# Patient Record
Sex: Male | Born: 1985
Health system: Southern US, Community
[De-identification: ages and names within clinical notes are randomized; demographics above are authoritative.]

---

## 2015-01-10 ENCOUNTER — Emergency Department (HOSPITAL_COMMUNITY)
Admission: EM | Admit: 2015-01-10 | Discharge: 2015-01-10 | Disposition: A | Discharge status: Home / Self Care | Payer: Self-pay | Attending: Emergency Medicine | Admitting: Emergency Medicine

## 2015-01-10 ENCOUNTER — Encounter (HOSPITAL_COMMUNITY): Payer: Self-pay | Admitting: *Deleted

## 2015-01-10 ENCOUNTER — Emergency Department (HOSPITAL_COMMUNITY): Payer: Self-pay

## 2015-01-10 DIAGNOSIS — M5441 Lumbago with sciatica, right side: Secondary | ICD-10-CM | POA: Insufficient documentation

## 2015-01-10 DIAGNOSIS — M79661 Pain in right lower leg: Secondary | ICD-10-CM | POA: Insufficient documentation

## 2015-01-10 MED ORDER — CYCLOBENZAPRINE HCL 10 MG PO TABS: 10.0000 mg | ORAL_TABLET | Freq: Two times a day (BID) | ORAL | Status: DC | PRN

## 2015-01-10 MED ORDER — CYCLOBENZAPRINE HCL 10 MG PO TABS
5.0000 mg | ORAL_TABLET | Freq: Once | ORAL | Status: AC
  Administered 2015-01-10: 5 mg via ORAL
  Filled 2015-01-10: qty 1

## 2015-01-10 MED ORDER — ONDANSETRON 4 MG PO TBDP
4.0000 mg | ORAL_TABLET | Freq: Once | ORAL | Status: AC
  Administered 2015-01-10: 4 mg via ORAL
  Filled 2015-01-10: qty 1

## 2015-01-10 MED ORDER — DEXAMETHASONE SODIUM PHOSPHATE 10 MG/ML IJ SOLN
10.0000 mg | Freq: Once | INTRAMUSCULAR | Status: AC
  Administered 2015-01-10: 10 mg via INTRAMUSCULAR
  Filled 2015-01-10: qty 1

## 2015-01-10 MED ORDER — HYDROCODONE-ACETAMINOPHEN 5-325 MG PO TABS: 1.0000 | ORAL_TABLET | ORAL | Status: DC | PRN

## 2015-01-10 MED ORDER — OXYCODONE-ACETAMINOPHEN 5-325 MG PO TABS
2.0000 | ORAL_TABLET | Freq: Once | ORAL | Status: AC
  Administered 2015-01-10: 2 via ORAL
  Filled 2015-01-10: qty 2

## 2015-01-10 MED ORDER — PREDNISONE 10 MG PO TABS: 20.0000 mg | ORAL_TABLET | Freq: Every day | ORAL | Status: DC

## 2015-01-10 NOTE — Discharge Instructions (Signed)
Citica  (Sciatica)  La citica es Conservation officer, historic buildings, debilidad, entumecimiento u hormigueo a lo largo del nervio citico. El nervio comienza en la zona inferior de la espalda y desciende por la parte posterior de cada pierna. El nervio controla los msculos de la parte inferior de la pierna y de la zona posterior de la rodilla, y transmite la sensibilidad a la parte posterior del muslo, la pierna y la planta del pie. La citica es un sntoma de otras afecciones mdicas. Por ejemplo, un dao a los nervios o algunas enfermedades como un disco herniado o un espoln seo en la columna vertebral, podran daarle o presionar en el nervio citico. Esto causa dolor, debilidad y otras sensaciones normalmente asociadas con la citica. Generalmente la citica afecta slo un lado del cuerpo. CAUSAS   Disco herniado o desplazado.  Enfermedad degenerativa del disco.  Un sndrome doloroso que compromete un msculo angosto de los glteos (sndrome piriforme).  Lesin o fractura plvica.  Embarazo.  Tumor (casos raros). SNTOMAS  Los sntomas pueden variar de leves a muy graves. Por lo general, los sntomas descienden desde la zona lumbar a las nalgas y la parte posterior de la pierna. Ellos son:   Hormigueo leve o dolor sordo en la parte inferior de la espalda, la pierna o la cadera.  Adormecimiento en la parte posterior de la pantorrilla o la planta del pie.  Sensacin de [employer] zona lumbar, la pierna o la cadera.  Dolor agudo en la zona inferior de la espalda, la pierna o la cadera.  Debilidad en las piernas.  Dolor de espalda intenso que [employer] movimientos. Los sntomas pueden empeorar al toser, Brewing technologist, rer o estar sentado o parado durante [employer]. Adems, el sobrepeso puede empeorar los sntomas.  DIAGNSTICO  Su mdico le har un examen fsico para buscar los sntomas comunes de la citica. Le pedir que haga algunos movimientos o actividades que activaran el dolor del nervio  citico. Para encontrar las causas de la citica podr indicarle otros estudios. Estos pueden ser:   Anlisis de Ignacio.  Radiografas.  Pruebas de diagnstico por imgenes, como resonancia magntica o tomografa computada. TRATAMIENTO  El tratamiento se dirige a las causas de la citica. A veces, el tratamiento no es necesario, y Conservation officer, historic buildings y Health and safety inspector desaparecen por s mismos. Si necesita tratamiento, su mdico puede sugerir:   Medicamentos de venta libre para Best boy.  Medicamentos recetados, como antiinflamatorios, relajantes musculares o narcticos.  Aplicacin de calor o hielo en la zona del dolor.  Inyecciones de corticoides para disminuir el dolor, la irritacin y la inflamacin alrededor del nervio.  Reduccin de la [employer] perodos de [place].  Ejercicios y estiramiento del abdomen para fortalecer y Teacher, English as a foreign language la flexibilidad de la columna vertebral. Su mdico puede sugerirle perder peso si el peso extra empeora el dolor de espalda.  Fisioterapia.  La ciruga para eliminar lo que presiona o pincha el nervio, como un espoln seo o parte de una hernia de disco. INSTRUCCIONES PARA EL CUIDADO EN EL HOGAR   Slo tome medicamentos de venta libre o recetados para Glass blower/designer o Health and safety inspector, segn las indicaciones de su mdico.  Aplique hielo sobre el rea dolorida durante 20 minutos 3-4 veces por da durante los primeras 48-72 horas. Luego intente aplicar calor de la misma manera.  Haga ejercicios, elongue o realice sus actividades habituales, si no le causan ms dolor.  Cumpla con todas las sesiones de fisioterapia, segn le  indique su mdico.  Cumpla con todas las visitas de control, segn le indique su mdico.  No use tacones altos o zapatos que no tengan buen apoyo.  Verifique que el colchn no sea muy blando. Un colchn firme Engineer, materials y las [place]. SOLICITE ATENCIN MDICA DE INMEDIATO SI:   Pierde el control de la vejiga o del intestino  (incontinencia).  Aumenta la debilidad en la zona inferior de la espalda, la pelvis, las nalgas o las piernas.  Siente irritacin o inflamacin en la espalda.  Tiene sensacin de ardor al [employer].  El dolor empeora cuando se acuesta o lo despierta por la noche.  El dolor es peor del que experiment en el pasado.  Dura ms de 4 semanas.  Pierde peso sin motivo de [place] sbita. ASEGRESE DE QUE:   Comprende estas instrucciones.  Controlar su enfermedad.  Solicitar ayuda de inmediato si no mejora o si empeora. Document Released: 07/17/2005 Document Revised: 01/16/2012 [facility] Patient Information 2015 [place], [place]. This information is not intended to replace advice given to you by your health care provider. Make sure you discuss any questions you have with your health care provider.  Dolor de espalda, adultos  (Back Pain, Adult)  El dolor de espalda es frecuente. Con frecuencia mejora luego de algn tiempo. La causa suele no ser un peligro para la vida. La mayora de las personas aprende a controlarlo por sus propios medios.  CUIDADOS EN EL HOGAR   Mantngase fsicamente activo. Si puede, comience a dar cortas caminatas en un suelo plano. Trate de caminar un poco ms cada da.  Nopermanezca sentado, de pie ni conduzca automviles durante ms de 30 minutos seguidos. Nose quede en la cama.  Noevite los ejercicios ni el trabajo. La actividad puede ayudar a que la espalda se cure ms rpido.  Tenga cuidado al inclinarse o al levantar un objeto. Doble las rodillas, mantenga el [place] cerca de su cuerpo y no gire.  Duerma sobre un [employer]. Acustese sobre un lado y doble las rodillas. Si se [employer], coloque una almohada debajo de las rodillas.  Tome la medicacin slo como le haya indicado el mdico.  Aplique hielo sobre la zona lesionada.  Ponga el hielo en una bolsa plstica.  Colquese una toalla entre la piel y la bolsa de hielo.  Deje la bolsa  de hielo durante 15 a 3 a 4 veces por da, durante los primeros 2 o 3 das. Luego puede ir alternando entre hielo y compresas calientes.  Consulte a su mdico sobre cul ejercicios o IT sales professional.  Evite sentirse ansioso o estresado. Encuentre la forma de enfrentar el estrs, como por Lexicographer ejercicios. SOLICITE AYUDA DE INMEDIATO SI:   El dolor no desaparece aunque haga reposo o tome medicamentos para Chief Technology Officer.  El dolor no desaparece en una semana.  Tiene nuevos problemas.  No se siente mejor.  El dolor se extiende a las piernas.  No puede controlar la orina o la materia fecal.  Siente que los brazos estn dbiles o pierde la sensibilidad (estn adormecidos).  Tiene Programme researcher, broadcasting/film/video (nuseas) y vmitos.  Siente dolor abdominal.  Siente que se desvanece (se desmaya). ASEGRESE DE QUE:   Comprende estas instrucciones.  Controlar su enfermedad.  Solicitar ayuda de inmediato si no mejora o si empeora. Document Released: 01/30/2011 Document Revised: 10/09/2011 [facility] Patient Information 2015 [place], [place]. This information is not intended to replace advice given to you by your health care provider.  Make sure you discuss any questions you have with your health care provider. ° °

## 2015-01-10 NOTE — ED Provider Notes (Signed)
CSN: [id number]     Arrival date & time 01/10/15  0855 History   First MD Initiated Contact with Patient 01/10/15 [phone]     Chief Complaint  Patient presents with  . Neck Pain  . Leg Pain     (Consider location/radiation/quality/duration/timing/severity/associated sxs/prior Treatment) HPI  PCP: No primary care provider on file. Blood pressure 125/77, pulse 91, temperature 98.8 F (37.1 C), temperature source Oral, resp. rate 18, SpO2 97 %.  Andrew Allen is a 29 y.o.male without any significant PMH presents to the ER with complaints of neck pain and low back pain. All of his symptoms started three days ago when he woke up and denies any known cause. He works at a Production assistant, radio at [employer] but denies lifting any heavy Proofreader. Two weeks ago he had swollen legs and ankles but it resolved. The pain is across the low back and then into the right hip and down to the mid thigh. He is able to walk but with significant pain. He denies having any weakness to the leg. The pain is worsened with ROM. Denies bowel or urine incontinence. Denies hx of joint pains or serious injury.    History reviewed. No pertinent past medical history. History reviewed. No pertinent past surgical history. History reviewed. No pertinent family history. History  Substance Use Topics  . Smoking status: Never Smoker   . Smokeless tobacco: Never Used  . Alcohol Use: No    Review of Systems  10 Systems reviewed and are negative for acute change except as noted in the HPI.    Allergies  Review of patient's allergies indicates no known allergies.  Home Medications   Prior to Admission medications   Medication Sig Start Date End Date Taking? Authorizing Provider  cyclobenzaprine (FLEXERIL) 10 MG tablet Take 1 tablet (10 mg total) by mouth 2 (two) times daily as needed for muscle spasms. 01/10/15   Marlon Pel, PA-C  HYDROcodone-acetaminophen (NORCO/VICODIN) 5-325 MG per tablet Take 1-2 tablets by mouth  every 4 (four) hours as needed. 01/10/15   Dreya Buhrman Neva Seat, PA-C  predniSONE (DELTASONE) 10 MG tablet Take 2 tablets (20 mg total) by mouth daily. 01/10/15   Duchess Armendarez Neva Seat, PA-C   BP 125/77 mmHg  Pulse 91  Temp(Src) 98.8 F (37.1 C) (Oral)  Resp 18  SpO2 97% Physical Exam  Constitutional: He appears well-developed and well-nourished. No distress.  HENT:  Head: Normocephalic and atraumatic.  Eyes: Pupils are equal, round, and reactive to light.  Neck: Normal range of motion. Neck supple.  Cardiovascular: Normal rate and regular rhythm.   Pulmonary/Chest: Effort normal.  Abdominal: Soft.  Musculoskeletal:       Back:  Pt has equal strength to bilateral lower extremities.  Neurosensory function adequate to both legs Skin color is normal. Skin is warm and moist.  I see no step off deformity, no midline bony tenderness.  Pt is able to ambulate.  No crepitus, laceration, effusion, induration, lesions, swelling.   Pedal pulses are symmetrical and palpable bilaterally  He exhibits no tenderness to palpation of low back or right hip.   Neurological: He is alert.  Skin: Skin is warm and dry.  Nursing note and vitals reviewed.   ED Course  Procedures (including critical care time) Labs Review Labs Reviewed - No data to display  Imaging Review Dg Cervical Spine Complete  01/10/2015   CLINICAL DATA:  29 year old male with cervical spine pain. No radiculopathy. No known injury.  EXAM: CERVICAL SPINE  4+ VIEWS  COMPARISON:  Concurrently obtained radiographs of the lumbar spine.  FINDINGS: No evidence of fracture or malalignment. No prevertebral soft tissue swelling. Normal bony mineralization without evidence of lytic or blastic osseous lesion. Mild spondylosis. Specifically, uncovertebral joint hypertrophy results in at least mild narrowing of the left neural foramen at C5-C6. This is visualized on the oblique view. No focal soft tissue abnormality. The odontoid is intact on the open-mouth  view.  IMPRESSION: 1. No evidence of acute fracture or malalignment. 2. Mild focal cervical spondylosis with probable neural foraminal narrowing on the left at C5-C6.   Electronically Signed   By: Malachy Moan M.D.   On: 01/10/2015 11:20   Dg Lumbar Spine Complete  01/10/2015   CLINICAL DATA:  Back pain, no known injury, initial encounter  EXAM: LUMBAR SPINE - COMPLETE 4+ VIEW  COMPARISON:  None.  FINDINGS: Bilateral pars defects are noted at L5 with grade 1 anterolisthesis of L5 on S1. No acute fracture is noted. Mild degenerative changes are seen particularly at L2-3 and L3-4.  IMPRESSION: Degenerative changes and bilateral pars defects as described.   Electronically Signed   By: Alcide Clever M.D.   On: 01/10/2015 11:18     EKG Interpretation None      MDM   Final diagnoses:  Midline low back pain with right-sided sciatica    The patients xray shows probable neural foraminal narrowing in the cervical spine and low back bilateral pars defect.   28 y.o.Andrew Allen  with back pain. No neurological deficits and normal neuro exam. Patient can walk. No loss of bowel or bladder control. No concern for cauda equina at this time base on HPI and physical exam findings. No fever, night sweats, weight loss, h/o cancer, IVDU.   Patient Plan 1. Medications: NSAIDs and muscle relaxer and prednisone. Cont usual home medications unless otherwise directed. 2. Treatment: rest, drink plenty of fluids, gentle stretching as discussed, alternate ice and heat  3. Follow Up: Please followup with your primary doctor tomorrow as scheduled, will potentially need MRI.  Advised to follow-up with the orthopedist if symptoms do not start to resolve in the next 2-3 days. If develop loss of bowel or urinary control return to the ED as soon as possible for further evaluation. To take the medications as prescribed as they can cause harm if not taken appropriately.   Vital signs are stable at  discharge. Filed Vitals:   01/10/15 1150  BP: 115/70  Pulse: 87  Temp: 98.4 F (36.9 C)  Resp: 16    Patient/guardian has voiced understanding and agreed to follow-up with the PCP or specialist.         Marlon Pel, PA-C 01/10/15 1152  Donnetta Hutching, MD 01/10/15 1437

## 2015-01-10 NOTE — ED Notes (Signed)
Declined W/C at D/C and was escorted to lobby by RN. 

## 2015-01-10 NOTE — ED Notes (Signed)
Pt reports his neck and back hurt and pain does sometimes radiate into legs. Pt reports 15 days ago both ankles were swollen but now swollen at this time. Pt reports he went to bed one night with out pain and woke with the pain . Pt denies any injury of any kind, and denies any Hx of surgeries .

## 2015-05-19 ENCOUNTER — Encounter: Payer: Self-pay | Admitting: Internal Medicine

## 2015-05-19 ENCOUNTER — Ambulatory Visit (INDEPENDENT_AMBULATORY_CARE_PROVIDER_SITE_OTHER): Payer: Self-pay | Admitting: Internal Medicine

## 2015-05-19 VITALS — BP 140/90 | HR 88 | Ht 62.0 in | Wt 179.0 lb

## 2015-05-19 DIAGNOSIS — M5430 Sciatica, unspecified side: Secondary | ICD-10-CM

## 2015-05-19 DIAGNOSIS — M25473 Effusion, unspecified ankle: Secondary | ICD-10-CM

## 2015-05-19 DIAGNOSIS — E785 Hyperlipidemia, unspecified: Secondary | ICD-10-CM

## 2015-05-19 NOTE — Progress Notes (Signed)
   Subjective:    Patient ID: Andrew Allen, male    DOB: 06/01/1986, 29 y.o.   MR3N: [id number]  HPI  1.  Back Pain:  Did not follow up in July as planned.  States his back pain continued for about another month, then resolved.    2.  Swelling of legs has not recurred since seen with back pain in June.  Suspect related to prednisone.    3.  Health Maintenance:  Had cholesterol checked 3 years ago.  Was high.  States he made changes with diet since then.        Review of Systems     Objective:   Physical Exam  NAD LUngs:  CTA CV:  RRR without murmur or rub, radial pulses normal and equal Back:  NT over  L/S spinous processes and paraspinous musculature bilaterally. Neuro:  MOtor 5/5, DTRs 2+/4 throughout of LE bilaterally LE: No edema.      Assessment & Plan:  1.  Back pain and leg swelling:  Resolved.  Discussed better way of lifting to protect back, to strengthen abdominal to protect back.  2.  Hyperlipidemia:  Will return for fasting labs  3.  Pt. Brings up at end of visit that has a large bill from ED visit.  Information given on Financial Assistance with [facility].

## 2015-05-19 NOTE — Patient Instructions (Signed)
Fill out application for Financial assistance and make appt. With [facility] Work on abdominal strength Work on diet and physical activity for weight loss

## 2015-05-31 ENCOUNTER — Other Ambulatory Visit: Payer: No Typology Code available for payment source | Admitting: Internal Medicine

## 2020-02-24 ENCOUNTER — Other Ambulatory Visit: Payer: Self-pay

## 2020-02-24 ENCOUNTER — Ambulatory Visit (HOSPITAL_COMMUNITY)
Admission: EM | Admit: 2020-02-24 | Discharge: 2020-02-24 | Disposition: A | Discharge status: Home / Self Care | Payer: HRSA Program | Attending: Family Medicine | Admitting: Family Medicine

## 2020-02-24 ENCOUNTER — Encounter (HOSPITAL_COMMUNITY): Payer: Self-pay

## 2020-02-24 DIAGNOSIS — J069 Acute upper respiratory infection, unspecified: Secondary | ICD-10-CM | POA: Diagnosis present

## 2020-02-24 DIAGNOSIS — Z20822 Contact with and (suspected) exposure to covid-19: Secondary | ICD-10-CM

## 2020-02-24 DIAGNOSIS — U071 COVID-19: Secondary | ICD-10-CM | POA: Diagnosis not present

## 2020-02-24 MED ORDER — PROMETHAZINE-DM 6.25-15 MG/5ML PO SYRP: 5.0000 mL | ORAL_SOLUTION | Freq: Every evening | ORAL | 0 refills | Status: DC | PRN

## 2020-02-24 MED ORDER — CETIRIZINE HCL 10 MG PO TABS: 10.0000 mg | ORAL_TABLET | Freq: Every day | ORAL | 0 refills | Status: DC

## 2020-02-24 MED ORDER — BENZONATATE 100 MG PO CAPS: 100.0000 mg | ORAL_CAPSULE | Freq: Three times a day (TID) | ORAL | 0 refills | Status: DC | PRN

## 2020-02-24 NOTE — Discharge Instructions (Signed)
Para el dolor de garganta o tos puede usar un t de miel. Use 3 cucharaditas de miel con jugo exprimido de medio limn. Coloque trozos de jengibre afeitado en 1/2-1 taza de agua y caliente sobre la estufa. Luego mezcle los ingredientes y repita cada 4 horas. Para fiebre, dolores de cuerpo tome ibuprofeno 400mg-600mg con comida cada 6 horas alternando con o junto con Tylenol 500mg-650mg cada 6 horas. Hidrata muy bien con al menos 2 litros (64 onzas) de agua al dia. Coma comidas ligeras como sopas para mantener los electrolitos y nutricion. Tambien puede tomar suero. Comience un antihistamnico como Zyrtec (cetirizina) 10mg al dia. Puede usar pseudoefedrina (Sudafed) de venta libre para el goteo posnasal, congestin a una dosis de 60 mg cada 8 horas o 120 mg cada 12 horas. Use el jarabe por la noche para su tos y las capsulas durante el dia.  

## 2020-02-24 NOTE — ED Triage Notes (Signed)
Pt requesting COVID testing after wife coworker tested +. States he has had fever x 4 days (not currently) and a non productive cough

## 2020-02-25 LAB — SARS CORONAVIRUS 2 (TAT 6-24 HRS): SARS Coronavirus 2: POSITIVE — AB

## 2020-02-26 ENCOUNTER — Telehealth: Payer: Self-pay | Admitting: Adult Health

## 2020-02-26 NOTE — Telephone Encounter (Signed)
Using [employer] interpreter Louis, called patient to discuss his eligiblity for monoclonal antibody treatment for COVID 19.  Did not answer, voice mail was left. Requested he call back at [phone]  Lillard Anes, NP

## 2020-02-28 ENCOUNTER — Inpatient Hospital Stay (HOSPITAL_COMMUNITY)
Admission: EM | Admit: 2020-02-28 | Discharge: 2020-03-09 | DRG: 177 | Disposition: A | Discharge status: Home / Self Care | Payer: HRSA Program | Attending: Internal Medicine | Admitting: Internal Medicine

## 2020-02-28 ENCOUNTER — Emergency Department (HOSPITAL_COMMUNITY): Payer: HRSA Program

## 2020-02-28 ENCOUNTER — Encounter (HOSPITAL_COMMUNITY): Payer: Self-pay | Admitting: *Deleted

## 2020-02-28 ENCOUNTER — Other Ambulatory Visit: Payer: Self-pay

## 2020-02-28 DIAGNOSIS — E871 Hypo-osmolality and hyponatremia: Secondary | ICD-10-CM

## 2020-02-28 DIAGNOSIS — E669 Obesity, unspecified: Secondary | ICD-10-CM | POA: Diagnosis present

## 2020-02-28 DIAGNOSIS — E875 Hyperkalemia: Secondary | ICD-10-CM | POA: Diagnosis present

## 2020-02-28 DIAGNOSIS — R748 Abnormal levels of other serum enzymes: Secondary | ICD-10-CM | POA: Diagnosis present

## 2020-02-28 DIAGNOSIS — R112 Nausea with vomiting, unspecified: Secondary | ICD-10-CM | POA: Diagnosis present

## 2020-02-28 DIAGNOSIS — J9600 Acute respiratory failure, unspecified whether with hypoxia or hypercapnia: Secondary | ICD-10-CM | POA: Diagnosis present

## 2020-02-28 DIAGNOSIS — Z6831 Body mass index (BMI) 31.0-31.9, adult: Secondary | ICD-10-CM

## 2020-02-28 DIAGNOSIS — J9601 Acute respiratory failure with hypoxia: Secondary | ICD-10-CM | POA: Diagnosis present

## 2020-02-28 DIAGNOSIS — T380X5A Adverse effect of glucocorticoids and synthetic analogues, initial encounter: Secondary | ICD-10-CM | POA: Diagnosis not present

## 2020-02-28 DIAGNOSIS — J1282 Pneumonia due to coronavirus disease 2019: Secondary | ICD-10-CM | POA: Diagnosis present

## 2020-02-28 DIAGNOSIS — U071 COVID-19: Principal | ICD-10-CM | POA: Diagnosis present

## 2020-02-28 DIAGNOSIS — D72829 Elevated white blood cell count, unspecified: Secondary | ICD-10-CM | POA: Diagnosis not present

## 2020-02-28 DIAGNOSIS — R7989 Other specified abnormal findings of blood chemistry: Secondary | ICD-10-CM

## 2020-02-28 HISTORY — DX: Acute respiratory failure, unspecified whether with hypoxia or hypercapnia: J96.00

## 2020-02-28 LAB — D-DIMER, QUANTITATIVE: D-Dimer, Quant: 1.2 ug/mL-FEU — ABNORMAL HIGH (ref 0.00–0.50)

## 2020-02-28 LAB — COMPREHENSIVE METABOLIC PANEL
ALT: 19 U/L (ref 0–44)
AST: 31 U/L (ref 15–41)
Albumin: 2.7 g/dL — ABNORMAL LOW (ref 3.5–5.0)
Alkaline Phosphatase: 43 U/L (ref 38–126)
Anion gap: 10 (ref 5–15)
BUN: 9 mg/dL (ref 6–20)
CO2: 23 mmol/L (ref 22–32)
Calcium: 7.8 mg/dL — ABNORMAL LOW (ref 8.9–10.3)
Chloride: 101 mmol/L (ref 98–111)
Creatinine, Ser: 0.84 mg/dL (ref 0.61–1.24)
GFR calc Af Amer: >60 mL/min (ref >60)
GFR calc non Af Amer: >60 mL/min (ref >60)
Glucose, Bld: 137 mg/dL — ABNORMAL HIGH (ref 70–99)
Potassium: 4.1 mmol/L (ref 3.5–5.1)
Sodium: 134 mmol/L — ABNORMAL LOW (ref 135–145)
Total Bilirubin: 0.8 mg/dL (ref 0.3–1.2)
Total Protein: 6.5 g/dL (ref 6.5–8.1)

## 2020-02-28 LAB — CBC WITH DIFFERENTIAL/PLATELET
Abs Immature Granulocytes: 0.04 10*3/uL (ref 0.00–0.07)
Basophils Absolute: 0 10*3/uL (ref 0.0–0.1)
Basophils Relative: 0 %
Eosinophils Absolute: 0 10*3/uL (ref 0.0–0.5)
Eosinophils Relative: 0 %
HCT: 35.8 % — ABNORMAL LOW (ref 39.0–52.0)
Hemoglobin: 11.6 g/dL — ABNORMAL LOW (ref 13.0–17.0)
Immature Granulocytes: 1 %
Lymphocytes Relative: 8 %
Lymphs Abs: 0.7 10*3/uL (ref 0.7–4.0)
MCH: 29.6 pg (ref 26.0–34.0)
MCHC: 32.4 g/dL (ref 30.0–36.0)
MCV: 91.3 fL (ref 80.0–100.0)
Monocytes Absolute: 0.5 10*3/uL (ref 0.1–1.0)
Monocytes Relative: 6 %
Neutro Abs: 6.9 10*3/uL (ref 1.7–7.7)
Neutrophils Relative %: 85 %
Platelets: 158 10*3/uL (ref 150–400)
RBC: 3.92 MIL/uL — ABNORMAL LOW (ref 4.22–5.81)
RDW: 13.2 % (ref 11.5–15.5)
WBC: 8.1 10*3/uL (ref 4.0–10.5)
nRBC: 0 % (ref 0.0–0.2)

## 2020-02-28 LAB — CREATININE, SERUM
Creatinine, Ser: 0.82 mg/dL (ref 0.61–1.24)
GFR calc Af Amer: >60 mL/min (ref >60)
GFR calc non Af Amer: >60 mL/min (ref >60)

## 2020-02-28 LAB — LACTATE DEHYDROGENASE: LDH: 365 U/L — ABNORMAL HIGH (ref 98–192)

## 2020-02-28 LAB — TRIGLYCERIDES: Triglycerides: 52 mg/dL (ref <150)

## 2020-02-28 LAB — FIBRINOGEN: Fibrinogen: 769 mg/dL — ABNORMAL HIGH (ref 210–475)

## 2020-02-28 LAB — HIV ANTIBODY (ROUTINE TESTING W REFLEX): HIV Screen 4th Generation wRfx: NONREACTIVE

## 2020-02-28 LAB — C-REACTIVE PROTEIN: CRP: 19.7 mg/dL — ABNORMAL HIGH (ref <1.0)

## 2020-02-28 LAB — FERRITIN: Ferritin: 413 ng/mL — ABNORMAL HIGH (ref 24–336)

## 2020-02-28 LAB — CBC
HCT: 39.6 % (ref 39.0–52.0)
Hemoglobin: 12.8 g/dL — ABNORMAL LOW (ref 13.0–17.0)
MCH: 30.3 pg (ref 26.0–34.0)
MCHC: 32.3 g/dL (ref 30.0–36.0)
MCV: 93.6 fL (ref 80.0–100.0)
Platelets: 149 10*3/uL — ABNORMAL LOW (ref 150–400)
RBC: 4.23 MIL/uL (ref 4.22–5.81)
RDW: 13.6 % (ref 11.5–15.5)
WBC: 10 10*3/uL (ref 4.0–10.5)
nRBC: 0 % (ref 0.0–0.2)

## 2020-02-28 LAB — LACTIC ACID, PLASMA
Lactic Acid, Venous: 1.1 mmol/L (ref 0.5–1.9)
Lactic Acid, Venous: 1.1 mmol/L (ref 0.5–1.9)

## 2020-02-28 LAB — ABO/RH: ABO/RH(D): O POS

## 2020-02-28 LAB — PROCALCITONIN: Procalcitonin: 0.22 ng/mL

## 2020-02-28 MED ORDER — BENZONATATE 100 MG PO CAPS
200.0000 mg | ORAL_CAPSULE | Freq: Three times a day (TID) | ORAL | Status: DC
  Administered 2020-02-28 – 2020-02-29 (×4): 200 mg via ORAL
  Filled 2020-02-28 (×3): qty 2

## 2020-02-28 MED ORDER — SODIUM CHLORIDE 0.9 % IV SOLN
200.0000 mg | Freq: Once | INTRAVENOUS | Status: AC
  Administered 2020-02-28: 200 mg via INTRAVENOUS
  Filled 2020-02-28: qty 40

## 2020-02-28 MED ORDER — DEXAMETHASONE SODIUM PHOSPHATE 10 MG/ML IJ SOLN: 6.0000 mg | Freq: Every day | INTRAMUSCULAR | Status: DC

## 2020-02-28 MED ORDER — ACETAMINOPHEN 325 MG PO TABS
650.0000 mg | ORAL_TABLET | Freq: Four times a day (QID) | ORAL | Status: DC | PRN
  Administered 2020-02-28 – 2020-03-07 (×5): 650 mg via ORAL
  Filled 2020-02-28 (×5): qty 2

## 2020-02-28 MED ORDER — METHYLPREDNISOLONE SODIUM SUCC 40 MG IJ SOLR
40.0000 mg | Freq: Two times a day (BID) | INTRAMUSCULAR | Status: DC
  Administered 2020-02-28 – 2020-03-05 (×13): 40 mg via INTRAVENOUS
  Filled 2020-02-28 (×13): qty 1

## 2020-02-28 MED ORDER — HYDROCOD POLST-CPM POLST ER 10-8 MG/5ML PO SUER
5.0000 mL | Freq: Two times a day (BID) | ORAL | Status: DC | PRN
  Administered 2020-02-28: 5 mL via ORAL
  Filled 2020-02-28: qty 5

## 2020-02-28 MED ORDER — POLYETHYLENE GLYCOL 3350 17 G PO PACK: 17.0000 g | PACK | Freq: Every day | ORAL | Status: DC | PRN

## 2020-02-28 MED ORDER — ENOXAPARIN SODIUM 40 MG/0.4ML ~~LOC~~ SOLN
40.0000 mg | SUBCUTANEOUS | Status: DC
  Administered 2020-02-28: 40 mg via SUBCUTANEOUS
  Filled 2020-02-28: qty 0.4

## 2020-02-28 MED ORDER — ONDANSETRON HCL 4 MG PO TABS: 4.0000 mg | ORAL_TABLET | Freq: Four times a day (QID) | ORAL | Status: DC | PRN

## 2020-02-28 MED ORDER — TOCILIZUMAB 400 MG/20ML IV SOLN
8.0000 mg/kg | Freq: Once | INTRAVENOUS | Status: AC
  Administered 2020-02-28: 650 mg via INTRAVENOUS
  Filled 2020-02-28: qty 32.5

## 2020-02-28 MED ORDER — ALBUTEROL SULFATE HFA 108 (90 BASE) MCG/ACT IN AERS
2.0000 | INHALATION_SPRAY | Freq: Four times a day (QID) | RESPIRATORY_TRACT | Status: DC
  Administered 2020-02-28 – 2020-03-03 (×15): 2 via RESPIRATORY_TRACT
  Filled 2020-02-28 (×2): qty 6.7

## 2020-02-28 MED ORDER — PHENOL 1.4 % MT LIQD
1.0000 | OROMUCOSAL | Status: DC | PRN
  Administered 2020-02-28: 1 via OROMUCOSAL
  Filled 2020-02-28: qty 177

## 2020-02-28 MED ORDER — GUAIFENESIN-DM 100-10 MG/5ML PO SYRP
10.0000 mL | ORAL_SOLUTION | Freq: Four times a day (QID) | ORAL | Status: DC
  Administered 2020-02-28 – 2020-03-05 (×26): 10 mL via ORAL
  Filled 2020-02-28 (×26): qty 10

## 2020-02-28 MED ORDER — SODIUM CHLORIDE 0.9 % IV SOLN
100.0000 mg | Freq: Every day | INTRAVENOUS | Status: AC
  Administered 2020-02-29 – 2020-03-03 (×4): 100 mg via INTRAVENOUS
  Filled 2020-02-28 (×4): qty 20

## 2020-02-28 MED ORDER — ONDANSETRON HCL 4 MG/2ML IJ SOLN: 4.0000 mg | Freq: Four times a day (QID) | INTRAMUSCULAR | Status: DC | PRN

## 2020-02-28 MED ORDER — ENOXAPARIN SODIUM 40 MG/0.4ML ~~LOC~~ SOLN
40.0000 mg | Freq: Two times a day (BID) | SUBCUTANEOUS | Status: DC
  Administered 2020-02-28 – 2020-03-05 (×12): 40 mg via SUBCUTANEOUS
  Filled 2020-02-28 (×12): qty 0.4

## 2020-02-28 MED ORDER — ACETAMINOPHEN 650 MG RE SUPP: 650.0000 mg | Freq: Four times a day (QID) | RECTAL | Status: DC | PRN

## 2020-02-28 NOTE — H&P (Signed)
PCP:   Julieanne Manson, MD   Chief Complaint:  Shortness of breath  HPI: This is a 34 year old male who states that since last Friday he has had fevers, headaches, generalized whole body aches, dizziness, dry persistent coughing, nausea with posttussive emesis, loss of appetite, no taste and shortness of breath.  The shortness of breath has been continually worsening.  He denies any wheezing.  His wife was diagnosed with Covid but he states this is the first time he sought any medical attention.  He has not been vaccinated.  In the ER he is hypoxic with sats into the 60s on room air with ambulation.  He was placed on a nonrebreather and his saturation is now in the low 90s. He is Covid positive.  The hospitalist have been asked admit.  History obtained from the patient is alert and oriented.  He is primarily Spanish-speaking.  Translator system used.  Review of Systems:  The patient denies weight loss,, vision loss, decreased hearing, hoarseness, chest pain, syncope, dyspnea on exertion, peripheral edema, balance deficits, hemoptysis, abdominal pain, melena, hematochezia, severe indigestion/heartburn, hematuria, incontinence, genital sores, muscle weakness, suspicious skin lesions, transient blindness, difficulty walking, depression, unusual weight change, abnormal bleeding, enlarged lymph nodes, angioedema, and breast masses. Positive: Anorexia, fever, coughing, dizziness, nausea, vomiting, shortness of breath, headaches, myalgia  Past Medical History: History reviewed. None History reviewed. None  Medications: Prior to Admission medications   Medication Sig Start Date End Date Taking? Authorizing Provider  benzonatate (TESSALON) 100 MG capsule Take 1-2 capsules (100-200 mg total) by mouth 3 (three) times daily as needed. 02/24/20   Wallis Bamberg, PA-C  cetirizine (ZYRTEC ALLERGY) 10 MG tablet Take 1 tablet (10 mg total) by mouth daily. 02/24/20   Wallis Bamberg, PA-C   promethazine-dextromethorphan (PROMETHAZINE-DM) 6.25-15 MG/5ML syrup Take 5 mLs by mouth at bedtime as needed for cough. 02/24/20   Wallis Bamberg, PA-C    Allergies: No known drug allergies  Social History:  reports that he has never smoked. He has never used smokeless tobacco. He reports that he does not drink alcohol and does not use drugs.  Family History: Family History  Family history unknown: Yes    Physical Exam: Vitals:   02/28/20 0130 02/28/20 0145 02/28/20 0200 02/28/20 0215  BP: 118/74 118/76 117/73 121/73  Pulse: 95 93 90 89  Resp: (!) 42 (!) 37 (!) 44 (!) 40  SpO2: 98% 96% 95% 97%  Weight:      Height:        General:  Alert and oriented times three, well developed and nourished, no acute distress Eyes: PERRLA, pink conjunctiva, no scleral icterus ENT: Moist oral mucosa, neck supple, no thyromegaly Lungs: clear to ascultation, no wheeze, no crackles, no use of accessory muscles Cardiovascular: regular rate and rhythm, no regurgitation, no gallops, no murmurs. No carotid bruits, no JVD Abdomen: soft, positive BS, non-tender, non-distended, no organomegaly, not an acute abdomen GU: not examined Neuro: CN II - XII grossly intact, sensation intact Musculoskeletal: strength 5/5 all extremities, no clubbing, cyanosis or edema Skin: no rash, no subcutaneous crepitation, no decubitus Psych: appropriate patient   Labs on Admission:  Recent Labs    02/28/20 0142  NA 134*  K 4.1  CL 101  CO2 23  GLUCOSE 137*  BUN 9  CREATININE 0.84  CALCIUM 7.8*   Recent Labs    02/28/20 0142  AST 31  ALT 19  ALKPHOS 43  BILITOT 0.8  PROT 6.5  ALBUMIN 2.7*  No results for input(s): LIPASE, AMYLASE in the last 72 hours. Recent Labs    02/28/20 0142  WBC 8.1  NEUTROABS 6.9  HGB 11.6*  HCT 35.8*  MCV 91.3  PLT 158   No results for input(s): CKTOTAL, CKMB, CKMBINDEX, TROPONINI in the last 72 hours. Invalid input(s): POCBNP Recent Labs    02/28/20 0142  DDIMER  1.20*   No results for input(s): HGBA1C in the last 72 hours. Recent Labs    02/28/20 0142  TRIG 52   No results for input(s): TSH, T4TOTAL, T3FREE, THYROIDAB in the last 72 hours.  Invalid input(s): FREET3 No results for input(s): VITAMINB12, FOLATE, FERRITIN, TIBC, IRON, RETICCTPCT in the last 72 hours.  Micro Results: Recent Results (from the past 240 hour(s))  SARS CORONAVIRUS 2 (TAT 6-24 HRS) Nasopharyngeal Nasopharyngeal Swab     Status: Abnormal   Collection Time: 02/24/20  8:11 PM   Specimen: Nasopharyngeal Swab  Result Value Ref Range Status   SARS Coronavirus 2 POSITIVE (A) NEGATIVE Final    Comment: EMAILED Charm Rings 1830 02/25/2020 WBOND (NOTE) SARS-CoV-2 target nucleic acids are DETECTED.  The SARS-CoV-2 RNA is generally detectable in upper and lower respiratory specimens during the acute phase of infection. Positive results are indicative of the presence of SARS-CoV-2 RNA. Clinical correlation with patient history and other diagnostic information is  necessary to determine patient infection status. Positive results do not rule out bacterial infection or co-infection with other viruses.  The expected result is Negative.  Fact Sheet for Patients: HairSlick.no  Fact Sheet for Healthcare Providers: quierodirigir.com  This test is not yet approved or cleared by the [place] FDA and  has been authorized for detection and/or diagnosis of SARS-CoV-2 by FDA under an Emergency Use Authorization (EUA). This EUA will remain  in effect (meaning this test can be used) for the duration of the COVID-19 declarati on under Section 564(b)(1) of the Act, 21 U.S.C. section 360bbb-3(b)(1), unless the authorization is terminated or revoked sooner.   Performed at [facility], [address], [place], [place] [address]      Radiological Exams on Admission: DG Chest Port 1 View  Result Date:  02/28/2020 CLINICAL DATA:  COVID positive he EXAM: PORTABLE CHEST 1 VIEW COMPARISON:  None. FINDINGS: The heart size and mediastinal contours are within normal limits. Multifocal patchy airspace opacities are seen throughout both lungs, predominantly within the periphery of the left lung. No pleural effusion. No acute osseous abnormality. IMPRESSION: Multifocal airspace opacities, left greater than right, consistent with multifocal pneumonia Electronically Signed   By: Jonna Clark M.D.   On: 02/28/2020 01:26    Assessment/Plan Present on Admission: . Acute respiratory failure due to COVID-19 [facility]) -Admit to med telemetry -IV Decadron and remdesivir ordered pharmacy to dose remdesivir -Inflammatory ordered this placed -Lovenox for DVT prophylaxis -Oxygen to keep sats greater than 88%, albuterol nebulizer -Respiratory to evaluate and treat  Lucrecia Mcphearson 02/28/2020, 3:15 AM

## 2020-02-28 NOTE — ED Notes (Signed)
The pt speaks no english intrepreter at bedside

## 2020-02-28 NOTE — ED Notes (Signed)
MD Made aware of sats of 81% on 15 LPM consistently. Pt appears tachpyneic, but no worse on work of breathing.

## 2020-02-28 NOTE — ED Notes (Signed)
SATS 90-91 ON 6 LITERS NASAL 02

## 2020-02-28 NOTE — ED Notes (Signed)
MD Ghimire returned page, per MD , place pt on HHFNC 15LPM w/ NRB over HHFNC. Called RT and placed pt on 15LPM NRB

## 2020-02-28 NOTE — ED Provider Notes (Signed)
[facility] EMERGENCY DEPARTMENT Provider Note   CSN: [id number] Arrival date & time: 02/28/20  0047     History Chief Complaint  Patient presents with  . Shortness of Breath    An Schnabel Gillermo Murdoch is a 34 y.o. male.  HPI     This is a 34 year old male with no reported past medical history who presents with shortness of breath and chills.  Patient tested positive for COVID-19 4 days ago.  He has not received his Covid vaccination.  He reports worsening chills and shortness of breath at home.  Also reports chest discomfort.  He has been taking Tylenol and Motrin with minimal relief.  He reports nausea without vomiting.  No abdominal pain.  Denies any medical comorbidities or smoking.  Per EMS, he was hypoxic in the 60s.  O2 sats improved to low 90s on a nonrebreather.  History taken with Spanish interpreter  History reviewed. No pertinent past medical history.  There are no problems to display for this patient.   History reviewed. No pertinent surgical history.     Family History  Family history unknown: Yes    Social History   Tobacco Use  . Smoking status: Never Smoker  . Smokeless tobacco: Never Used  Substance Use Topics  . Alcohol use: No  . Drug use: No    Home Medications Prior to Admission medications   Medication Sig Start Date End Date Taking? Authorizing Provider  benzonatate (TESSALON) 100 MG capsule Take 1-2 capsules (100-200 mg total) by mouth 3 (three) times daily as needed. 02/24/20   Wallis Bamberg, PA-C  cetirizine (ZYRTEC ALLERGY) 10 MG tablet Take 1 tablet (10 mg total) by mouth daily. 02/24/20   Wallis Bamberg, PA-C  promethazine-dextromethorphan (PROMETHAZINE-DM) 6.25-15 MG/5ML syrup Take 5 mLs by mouth at bedtime as needed for cough. 02/24/20   Wallis Bamberg, PA-C    Allergies    Patient has no known allergies.  Review of Systems   Review of Systems  Constitutional: Positive for chills and fever.  Respiratory: Positive for  cough and shortness of breath.   Cardiovascular: Positive for chest pain.  Gastrointestinal: Positive for nausea. Negative for abdominal pain.  Genitourinary: Negative for dysuria.  All other systems reviewed and are negative.   Physical Exam Updated Vital Signs BP 121/73   Pulse 89   Resp (!) 40   Ht 1.575 m (5\' 2" )   Wt 81.2 kg   SpO2 97%   BMI 32.74 kg/m   Physical Exam Vitals and nursing note reviewed.  Constitutional:      Comments: Ill-appearing but nontoxic, ABCs intact  HENT:     Head: Normocephalic and atraumatic.     Mouth/Throat:     Mouth: Mucous membranes are moist.  Eyes:     Pupils: Pupils are equal, round, and reactive to light.  Cardiovascular:     Rate and Rhythm: Regular rhythm. Tachycardia present.     Heart sounds: Normal heart sounds. No murmur heard.   Pulmonary:     Effort: Respiratory distress present.     Breath sounds: Rales present. No wheezing.     Comments: Speaking in short sentences, tachypnea noted, nonrebreather in place Abdominal:     General: Bowel sounds are normal.     Palpations: Abdomen is soft.     Tenderness: There is no abdominal tenderness. There is no rebound.  Musculoskeletal:     Cervical back: Neck supple.     Right lower leg: No tenderness. No edema.  Left lower leg: No tenderness. No edema.  Lymphadenopathy:     Cervical: No cervical adenopathy.  Skin:    General: Skin is warm and dry.  Neurological:     Mental Status: He is alert and oriented to person, place, and time.  Psychiatric:        Mood and Affect: Mood normal.     ED Results / Procedures / Treatments   Labs (all labs ordered are listed, but only abnormal results are displayed) Labs Reviewed  CBC WITH DIFFERENTIAL/PLATELET - Abnormal; Notable for the following components:      Result Value   RBC 3.92 (*)    Hemoglobin 11.6 (*)    HCT 35.8 (*)    All other components within normal limits  COMPREHENSIVE METABOLIC PANEL - Abnormal; Notable for  the following components:   Sodium 134 (*)    Glucose, Bld 137 (*)    Calcium 7.8 (*)    Albumin 2.7 (*)    All other components within normal limits  D-DIMER, QUANTITATIVE (NOT AT [facility]) - Abnormal; Notable for the following components:   D-Dimer, Quant 1.20 (*)    All other components within normal limits  LACTATE DEHYDROGENASE - Abnormal; Notable for the following components:   LDH 365 (*)    All other components within normal limits  FIBRINOGEN - Abnormal; Notable for the following components:   Fibrinogen 769 (*)    All other components within normal limits  CULTURE, BLOOD (ROUTINE X 2)  CULTURE, BLOOD (ROUTINE X 2)  LACTIC ACID, PLASMA  TRIGLYCERIDES  LACTIC ACID, PLASMA  PROCALCITONIN  FERRITIN  C-REACTIVE PROTEIN    EKG EKG Interpretation  Date/Time:  Saturday February 28 2020 00:48:09 EDT Ventricular Rate:  101 PR Interval:    QRS Duration: 82 QT Interval:  329 QTC Calculation: 427 R Axis:   74 Text Interpretation: Sinus tachycardia Confirmed by Ross Marcus ([id number]) on 02/28/2020 1:24:17 AM   Radiology DG Chest Port 1 View  Result Date: 02/28/2020 CLINICAL DATA:  COVID positive he EXAM: PORTABLE CHEST 1 VIEW COMPARISON:  None. FINDINGS: The heart size and mediastinal contours are within normal limits. Multifocal patchy airspace opacities are seen throughout both lungs, predominantly within the periphery of the left lung. No pleural effusion. No acute osseous abnormality. IMPRESSION: Multifocal airspace opacities, left greater than right, consistent with multifocal pneumonia Electronically Signed   By: Jonna Clark M.D.   On: 02/28/2020 01:26    Procedures Procedures (including critical care time)  CRITICAL CARE Performed by: Shon Baton   Total critical care time: 45 minutes  Critical care time was exclusive of separately billable procedures and treating other patients.  Critical care was necessary to treat or prevent imminent or life-threatening  deterioration.  Critical care was time spent personally by me on the following activities: development of treatment plan with patient and/or surrogate as well as nursing, discussions with consultants, evaluation of patient's response to treatment, examination of patient, obtaining history from patient or surrogate, ordering and performing treatments and interventions, ordering and review of laboratory studies, ordering and review of radiographic studies, pulse oximetry and re-evaluation of patient's condition.   Medications Ordered in ED Medications - No data to display  ED Course  I have reviewed the triage vital signs and the nursing notes.  Pertinent labs & imaging results that were available during my care of the patient were reviewed by me and considered in my medical decision making (see chart for details).    MDM  Rules/Calculators/A&P                           Patient presents with shortness of breath and respiratory distress.  Patient tested positive for COVID-19 4 days ago.  He is tachypneic and speaking in short sentences.  Placed on nonrebreather by EMS.  He is otherwise low risk without any comorbidities.  COVID-19 lab work sent.  Chest x-ray is consistent with COVID-19 pneumonia.  Lower suspicion for PE.  EKG shows sinus tachycardia without ischemic changes.  Lab work-up is largely reassuring.  He does have some mild elevation in his fibrinogen, LDH, and D-dimer.  No leukocytosis or significant metabolic derangements.  I have requested nursing attempt to titrate down his oxygen requirement.  However, given that he is requiring supplemental oxygen and in mild respiratory distress, he will need to be admitted.  Kase Chipol Gillermo Murdoch was evaluated in Emergency Department on 02/28/2020 for the symptoms described in the history of present illness. He was evaluated in the context of the global COVID-19 pandemic, which necessitated consideration that the patient might be at risk for  infection with the SARS-CoV-2 virus that causes COVID-19. Institutional protocols and algorithms that pertain to the evaluation of patients at risk for COVID-19 are in a state of rapid change based on information released by regulatory bodies including the CDC and federal and state organizations. These policies and algorithms were followed during the patient's care in the ED.   Final Clinical Impression(s) / ED Diagnoses Final diagnoses:  Pneumonia due to COVID-19 virus  Acute respiratory failure with hypoxia [facility])    Rx / DC Orders ED Discharge Orders    None       Shon Baton, MD 02/28/20 [phone]

## 2020-02-28 NOTE — Progress Notes (Signed)
Pt admitted from ED to 5W17 A&O x4; Spanish Speaking, interpreter used. VS stable other than RR elevated. MD aware. Tele monitor placed & verified. Skin intact; IV L FA SL, clean dry intact. Satting 88% on HFNC & non-rebreather mask. No acute distress noted. Call bell within reach. All questions/concerns addressed. Will continue to monitor.    02/28/20 1244  Assess: MEWS Score  Temp 99.4 F (37.4 C)  BP 113/69  Pulse Rate 80  ECG Heart Rate 79  Resp (!) 26  Level of Consciousness Alert  SpO2 (!) 88 %  O2 Device HFNC;Non-rebreather Mask  Patient Activity (if Appropriate) In bed  O2 Flow Rate (L/min) 15 L/min  Assess: MEWS Score  MEWS Temp 0  MEWS Systolic 0  MEWS Pulse 0  MEWS RR 2  MEWS LOC 0  MEWS Score 2  MEWS Score Color Yellow  Assess: if the MEWS score is Yellow or Red  Were vital signs taken at a resting state? Yes  Focused Assessment No change from prior assessment (Pt from ED, no change. Tachypnic)  Early Detection of Sepsis Score *See Row Information* Low  MEWS guidelines implemented *See Row Information* Yes  Treat  Pain Scale 0-10  Pain Score 8  Pain Type Acute pain  Pain Location Generalized  Pain Orientation Right;Left  Pain Descriptors / Indicators Aching;Discomfort  Pain Frequency Intermittent  Pain Onset On-going  Pain Intervention(s) Repositioned  Take Vital Signs  Increase Vital Sign Frequency  Yellow: Q 2hr X 2 then Q 4hr X 2, if remains yellow, continue Q 4hrs  Notify: Provider  Provider Name/Title Dr. Jerral Ralph (MD aware of Pt status)  Date Provider Notified 02/28/20  Time Provider Notified 1255  Notification Type Face-to-face  Response No new orders  Document  Patient Outcome Other (Comment) (Tachypnic, otherwise stable)  Progress note created (see row info) Yes

## 2020-02-28 NOTE — Progress Notes (Addendum)
PROGRESS NOTE                                                                                                                                                                                                             Patient Demographics:    Andrew Allen, is a 34 y.o. male, DOB - January 03, 1986, PPI:[id number]  Outpatient Primary MD for the patient is Julieanne Manson, MD   Admit date - 02/28/2020   LOS - 0  Chief Complaint  Patient presents with   Shortness of Breath       Brief Narrative: Patient is a 34 y.o. male with no PMHx presented to the hospital with approximately 1 week history of fever, cough, myalgias-he was diagnosed with COVID-19 on 7/27 (unvaccinated against COVID-19)-upon further evaluation-he was found to have severe hypoxemia requiring 15 L HFNC secondary to COVID-19 pneumonia.  Significant Events: 7/27>> COVID-19 positive 7/30>> admit to [facility] for severe hypoxemia requiring 15 L HF [place] secondary to COVID-19 pneumonia  Significant studies: 7/31>> chest x-ray: Multifocal pneumonia  COVID-19 medications: Steroids: 7/31>> Remdesivir: 7/31>> Actemra: 7/31 x 1  Antibiotics: None  Microbiology data: 7/31>> blood culture: Pending  Procedures: None  Consults: None  DVT prophylaxis: enoxaparin (LOVENOX) injection 40 mg Start: 02/28/20 1000-change to BID dosing given on HFNC SCDs Start: 02/28/20 0630    Subjective:    Andrew Allen today is lying comfortably in bed-on 15 L of high flow oxygen.   Assessment  & Plan :   Acute Hypoxic Resp Failure due to Covid 19 Viral pneumonia: Severe severe parenchymal lung infection from COVID-19-requiring 15 L HFNC-but stable/comfortable.  Continue steroids/remdesivir.  Rationale/risks/benefits of Actemra use discussed with patient in detail (bedside-iPad translator utilized)-he has no history of TB, hepatitis B, diverticulitis-and agrees to  use of Actemra.  Monitor closely-an attempt to titrate down FiO2.  Fever: afebrile O2 requirements:  SpO2: 92 % O2 Flow Rate (L/min): 15 L/min   COVID-19 Labs: Recent Labs    02/28/20 0142  DDIMER 1.20*  FERRITIN 413*  LDH 365*  CRP 19.7*    No results found for: BNP  Recent Labs  Lab 02/28/20 0142  PROCALCITON 0.22    Lab Results  Component Value Date   SARSCOV2NAA POSITIVE (A) 02/24/2020    Prone/Incentive Spirometry: encouraged patient to lie prone for 3-4 hours at a  time for a total of 16 hours a day, and to encourage incentive spirometry use 3-4/hour.   Obesity: Estimated body mass index is 32.74 kg/m as calculated from the following:   Height as of this encounter: 5\' 2"  (1.575 m).   Weight as of this encounter: 81.2 kg.    GI prophylaxis: PPI  ABG: No results found for: PHART, PCO2ART, PO2ART, HCO3, TCO2, ACIDBASEDEF, O2SAT  Vent Settings: N/A  Condition - Extremely Guarded  Family Communication  : Left voicemail for patient's friend at .  Code Status :  Full Code  Diet :  Diet Order            Diet regular Room service appropriate? Yes; Fluid consistency: Thin  Diet effective now                  Disposition Plan  :  Status is: Inpatient  Remains inpatient appropriate because:Inpatient level of care appropriate due to severity of illness   Dispo: The patient is from: Home              Anticipated d/c is to: Home              Anticipated d/c date is: > 3 days              Patient currently is not medically stable to d/c.   Barriers to discharge: Hypoxia requiring O2 supplementation/complete 5 days of IV Remdesivir  Antimicorbials  :    Anti-infectives (From admission, onward)   Start     Dose/Rate Route Frequency Ordered Stop   02/29/20 1000  remdesivir 100 mg in sodium chloride 0.9 % 100 mL IVPB     Discontinue    "Followed by" Linked Group Details   100 mg 200 mL/hr over 30 Minutes Intravenous Daily 02/28/20 0514  03/04/20 0959   02/28/20 0630  remdesivir 200 mg in sodium chloride 0.9% 250 mL IVPB       "Followed by" Linked Group Details   200 mg 580 mL/hr over 30 Minutes Intravenous Once 02/28/20 0514 02/28/20 0941      Inpatient Medications  Scheduled Meds:  albuterol  2 puff Inhalation Q6H   enoxaparin (LOVENOX) injection  40 mg Subcutaneous Q24H   guaiFENesin-dextromethorphan  10 mL Oral Q6H   methylPREDNISolone (SOLU-MEDROL) injection  40 mg Intravenous BID   Continuous Infusions:  [START ON 02/29/2020] remdesivir 100 mg in NS 100 mL     tocilizumab (ACTEMRA) - non-COVID treatment     PRN Meds:.acetaminophen **OR** acetaminophen, chlorpheniramine-HYDROcodone, ondansetron **OR** ondansetron (ZOFRAN) IV, polyethylene glycol   Time Spent in minutes  35   See all Orders from today for further details   04/30/2020 M.D on 02/28/2020 at 11:05 AM  To page go to [email or web address] - use universal password  [facility] -  Office  [phone]    Objective:   Vitals:   02/28/20 0429 02/28/20 0544 02/28/20 0800 02/28/20 0815  BP:   (!) 154/82 (!) 146/85  Pulse:   88 89  Resp:   (!) 27 (!) 39  Temp: (!) 100.6 F (38.1 C)     SpO2:  92% 90% 92%  Weight:      Height:        Wt Readings from Last 3 Encounters:  02/28/20 81.2 kg  05/19/15 81.2 kg    No intake or output data in the 24 hours ending 02/28/20 1105   Physical Exam Gen Exam:Alert awake-not in any distress HEENT:atraumatic, normocephalic Chest:  B/L clear to auscultation anteriorly CVS:S1S2 regular Abdomen:soft non tender, non distended Extremities:no edema Neurology: Non focal Skin: no rash   Data Review:    CBC Recent Labs  Lab 02/28/20 0142 02/28/20 0625  WBC 8.1 10.0  HGB 11.6* 12.8*  HCT 35.8* 39.6  PLT 158 149*  MCV 91.3 93.6  MCH 29.6 30.3  MCHC 32.4 32.3  RDW 13.2 13.6  LYMPHSABS 0.7  --   MONOABS 0.5  --   EOSABS 0.0  --   BASOSABS 0.0  --     Chemistries  Recent Labs    Lab 02/28/20 0142 02/28/20 0625  NA 134*  --   K 4.1  --   CL 101  --   CO2 23  --   GLUCOSE 137*  --   BUN 9  --   CREATININE 0.84 0.82  CALCIUM 7.8*  --   AST 31  --   ALT 19  --   ALKPHOS 43  --   BILITOT 0.8  --    ------------------------------------------------------------------------------------------------------------------ Recent Labs    02/28/20 0142  TRIG 52    No results found for: HGBA1C ------------------------------------------------------------------------------------------------------------------ No results for input(s): TSH, T4TOTAL, T3FREE, THYROIDAB in the last 72 hours.  Invalid input(s): FREET3 ------------------------------------------------------------------------------------------------------------------ Recent Labs    02/28/20 0142  FERRITIN 413*    Coagulation profile No results for input(s): INR, PROTIME in the last 168 hours.  Recent Labs    02/28/20 0142  DDIMER 1.20*    Cardiac Enzymes No results for input(s): CKMB, TROPONINI, MYOGLOBIN in the last 168 hours.  Invalid input(s): CK ------------------------------------------------------------------------------------------------------------------ No results found for: BNP  Micro Results Recent Results (from the past 240 hour(s))  SARS CORONAVIRUS 2 (TAT 6-24 HRS) Nasopharyngeal Nasopharyngeal Swab     Status: Abnormal   Collection Time: 02/24/20  8:11 PM   Specimen: Nasopharyngeal Swab  Result Value Ref Range Status   SARS Coronavirus 2 POSITIVE (A) NEGATIVE Final    Comment: EMAILED Charm RingsM BROWING,RN 1830 02/25/2020 WBOND (NOTE) SARS-CoV-2 target nucleic acids are DETECTED.  The SARS-CoV-2 RNA is generally detectable in upper and lower respiratory specimens during the acute phase of infection. Positive results are indicative of the presence of SARS-CoV-2 RNA. Clinical correlation with patient history and other diagnostic information is  necessary to determine patient  infection status. Positive results do not rule out bacterial infection or co-infection with other viruses.  The expected result is Negative.  Fact Sheet for Patients: HairSlick.nohttps://www.fda.gov/media/138098/download  Fact Sheet for Healthcare Providers: quierodirigir.comhttps://www.fda.gov/media/138095/download  This test is not yet approved or cleared by the [place] FDA and  has been authorized for detection and/or diagnosis of SARS-CoV-2 by FDA under an Emergency Use Authorization (EUA). This EUA will remain  in effect (meaning this test can be used) for the duration of the COVID-19 declarati on under Section 564(b)(1) of the Act, 21 U.S.C. section 360bbb-3(b)(1), unless the authorization is terminated or revoked sooner.   Performed at [facility], [address] [place], [place] [address]     Radiology Reports DG Chest [place]  Result Date: 02/28/2020 CLINICAL DATA:  COVID positive he EXAM: PORTABLE CHEST 1 VIEW COMPARISON:  None. FINDINGS: The heart size and mediastinal contours are within normal limits. Multifocal patchy airspace opacities are seen throughout both lungs, predominantly within the periphery of the left lung. No pleural effusion. No acute osseous abnormality. IMPRESSION: Multifocal airspace opacities, left greater than right, consistent with multifocal pneumonia Electronically Signed   By: Kandis FantasiaBindu  Avutu M.D.   On: 02/28/2020 01:26

## 2020-02-28 NOTE — ED Notes (Signed)
Non-rebreather rmoved nasal 02 at 6 liters replaced the mask  sats dropped into mthe mid-79s    Nasal 02 replaced at 6 liters the pts saTS   88-89 %

## 2020-02-28 NOTE — ED Triage Notes (Signed)
The pt arrived by gems from home c/o a fever and sob  He tested pos for covid at a urgent care Monday  The pt has a headache cough and fever  He last took motrin  4 hours ago and tylenol 2 hboours before then  He has been alternating both drugs

## 2020-02-28 NOTE — Progress Notes (Signed)
The rationale for the off label use of Actemra its known side effects, potential benefits was  discussed with patient .The use of Actemra is based on published clinical articles/anecdotal data as several randomized trials have been negative, but lately there have been a few positive randomized trials as well.  There is no history of tuberculosis, hepatitis B or C.  Complete risks and long-term side effects are unknown, however in the best clinical judgment it is felt that the clinical benefit at this time outweighs medical risks given tenuous clinical state of the patient.  Patient agrees with the treatment plan and consent to the use of Actemra.   

## 2020-02-28 NOTE — Progress Notes (Signed)
Pt placed on Heated HFNC at this time due to low spo2.

## 2020-02-29 LAB — COMPREHENSIVE METABOLIC PANEL
ALT: 41 U/L (ref 0–44)
AST: 53 U/L — ABNORMAL HIGH (ref 15–41)
Albumin: 2.6 g/dL — ABNORMAL LOW (ref 3.5–5.0)
Alkaline Phosphatase: 52 U/L (ref 38–126)
Anion gap: 8 (ref 5–15)
BUN: 17 mg/dL (ref 6–20)
CO2: 27 mmol/L (ref 22–32)
Calcium: 8.7 mg/dL — ABNORMAL LOW (ref 8.9–10.3)
Chloride: 102 mmol/L (ref 98–111)
Creatinine, Ser: 0.72 mg/dL (ref 0.61–1.24)
GFR calc Af Amer: >60 mL/min (ref >60)
GFR calc non Af Amer: >60 mL/min (ref >60)
Glucose, Bld: 187 mg/dL — ABNORMAL HIGH (ref 70–99)
Potassium: 4.6 mmol/L (ref 3.5–5.1)
Sodium: 137 mmol/L (ref 135–145)
Total Bilirubin: 0.6 mg/dL (ref 0.3–1.2)
Total Protein: 6.9 g/dL (ref 6.5–8.1)

## 2020-02-29 LAB — D-DIMER, QUANTITATIVE: D-Dimer, Quant: 0.89 ug/mL-FEU — ABNORMAL HIGH (ref 0.00–0.50)

## 2020-02-29 LAB — C-REACTIVE PROTEIN: CRP: 21.8 mg/dL — ABNORMAL HIGH (ref <1.0)

## 2020-02-29 LAB — FERRITIN: Ferritin: 758 ng/mL — ABNORMAL HIGH (ref 24–336)

## 2020-02-29 MED ORDER — HYDROCOD POLST-CPM POLST ER 10-8 MG/5ML PO SUER
5.0000 mL | Freq: Two times a day (BID) | ORAL | Status: DC
  Administered 2020-02-29 – 2020-03-09 (×17): 5 mL via ORAL
  Filled 2020-02-29 (×17): qty 5

## 2020-02-29 NOTE — Evaluation (Addendum)
Physical Therapy Evaluation Patient Details Name: Andrew Allen MRN: [id number] DOB: 1986/03/01 Today's Date: 02/29/2020   History of Present Illness  34 y/o presenting with +COVID-19 with fevers, headaches, generalized whole body aches, dizziness, dry persistent coughing, nausea with posttussive emesis, loss of appetite, no taste and shortness of breath. No significant PMH on file    Clinical Impression  Pt admitted with above diagnosis. PTA pt independent. 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 On eval, he required min guard assist transfers. Pt on 30L O2 via HHFNC + 15L NRB. Desat to 88% during mobility. SpO2 94% at rest in recliner. Pt will benefit from skilled PT to increase their independence and safety with mobility to allow discharge to the venue listed below.  Suspect pt will progress well with mobility and not require follow-up services.     Follow Up Recommendations No PT follow up    Equipment Recommendations  None recommended by PT    Recommendations for Other Services       Precautions / Restrictions Precautions Precautions: Other (comment) Precaution Comments: Watch O2 sats Restrictions Weight Bearing Restrictions: No      Mobility  Bed Mobility Overal bed mobility: Modified Independent                Transfers Overall transfer level: Needs assistance Equipment used: None Transfers: Sit to/from Stand;Stand Pivot Transfers Sit to Stand: Min guard Stand pivot transfers: Min guard       General transfer comment: min guard assist for safety/lines  Ambulation/Gait                Stairs            Wheelchair Mobility    Modified Rankin (Stroke Patients Only)       Balance Overall balance assessment: Mild deficits observed, not formally tested                                           Pertinent Vitals/Pain Pain Assessment: No/denies pain    Home Living  Family/patient expects to be discharged to:: Private residence Living Arrangements: Spouse/significant other;Children Available Help at Discharge: Family;Available 24 hours/day Type of Home: House       Home Layout: Two level;Bed/bath upstairs Home Equipment: None      Prior Function Level of Independence: Independent         Comments: 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 [employer]        Extremity/Trunk Assessment   Upper Extremity Assessment Upper Extremity Assessment: Defer to OT evaluation    Lower Extremity Assessment Lower Extremity Assessment: Generalized weakness    Cervical / Trunk Assessment Cervical / Trunk Assessment: Normal  Communication   Communication: No difficulties  Cognition Arousal/Alertness: Awake/alert Behavior During Therapy: WFL for tasks assessed/performed Overall Cognitive Status: Within Functional Limits for tasks assessed                                        General Comments General comments (skin integrity, edema, etc.): Pt on 30L HHFNC + 15L NRB. Desat to 88% during mobility. SpO2 94% at rest in recliner. iPad interpreter utilized during session.    Exercises Other Exercises Other Exercises: Flutter x10 breaths;  Incentive spirometer x10 breaths ~600 mL   Assessment/Plan    PT Assessment Patient needs continued PT services  PT Problem List Decreased strength;Decreased mobility;Decreased balance;Decreased activity tolerance;Cardiopulmonary status limiting activity       PT Treatment Interventions Therapeutic activities;Gait training;Therapeutic exercise;Patient/family education;DME instruction;Balance training;Stair training;Functional mobility training    PT Goals (Current goals can be found in the Care Plan section)  Acute Rehab PT Goals Patient Stated Goal: return to independence PT Goal Formulation: With patient Time For Goal Achievement: 03/14/20 Potential to Achieve Goals: Good    Frequency Min  3X/week   Barriers to discharge        Co-evaluation               AM-PAC PT "6 Clicks" Mobility  Outcome Measure Help needed turning from your back to your side while in a flat bed without using bedrails?: None Help needed moving from lying on your back to sitting on the side of a flat bed without using bedrails?: None Help needed moving to and from a bed to a chair (including a wheelchair)?: A Little Help needed standing up from a chair using your arms (e.g., wheelchair or bedside chair)?: A Little Help needed to walk in hospital room?: A Little Help needed climbing 3-5 steps with a railing? : A Little 6 Click Score: 20    End of Session Equipment Utilized During Treatment: Oxygen Activity Tolerance: Patient tolerated treatment well Patient left: in chair;with call bell/phone within reach Nurse Communication: Mobility status PT Visit Diagnosis: Difficulty in walking, not elsewhere classified (R26.2);Muscle weakness (generalized) (M62.81);Unsteadiness on feet (R26.81)    Time: 8938-1017 PT Time Calculation (min) (ACUTE ONLY): 21 min   Charges:   PT Evaluation $PT Eval Moderate Complexity: 1 Mod          [name], PT  Office # [phone] Pager [phone]   [name] 02/29/2020, 1:44 PM

## 2020-02-29 NOTE — Progress Notes (Signed)
PROGRESS NOTE    Andrew Allen  YKD:[id number] DOB: 1985-09-19 DOA: 02/28/2020 PCP: Julieanne Manson, MD    Brief Narrative:  Patient admitted to the hospital with working diagnosis of acute hypoxic respiratory failure due to SARS COVID-19 viral pneumonia.  34 year old male with no significant past medical history.  Patient reported 8 days of generalized malaise, body aches, dizziness, dry cough, nausea, loss of appetite, sense of smell and taste.  He had worsening dyspnea decided to come to the hospital for further evaluation.  On his initial physical examination his oximetry on room air on ambulation was 60%.  His lungs were clear to auscultation bilaterally, heart S1-S2, present rhythmic, abdomen soft, no lower extremity edema. Sodium 134, potassium 4.1, chloride 101, bicarb 23, glucose 137, BUN 9, creatinine 0.84, white count 8.1, hemoglobin 9.6, hematocrit 35.8, platelets 158.  SARS COVID-19 positive. Chest radiograph had bilateral interstitial infiltrates, left upper lobe (predominant), right upper lobe, right lower lobe.  Patient has been placed on systemic steroids, remdesivir and received 1 dose of Tocilizumab.  Assessment & Plan:   Principal Problem:   Acute respiratory failure due to COVID-19 (HCC)   1.  Acute hypoxic respiratory failure due to SARS COVID-19 viral pneumonia.  RR: 25  Pulse oxymetry: 87 Fi02: 35 L/ min HFNC / non rebreather mask   COVID-19 Labs  Recent Labs    02/28/20 0142 02/29/20 0508  DDIMER 1.20* 0.89*  FERRITIN 413* 758*  LDH 365*  --   CRP 19.7* 21.8*    Lab Results  Component Value Date   SARSCOV2NAA POSITIVE (A) 02/24/2020    Patient continue to have significant dyspnea with minimal efforts and at rest. Continue to have very high oxygen requirements.  Inflammatory markers continue to be elevated.  Sp tociluzimab 07/31. Continue medical therapy with remdesivir and systemic steroids.  Continue bronchodilator therapy,  antitussive agents and airway clearing technique with incentive spirometer and flutter valve.   Encourage to be out of bed and prone position while in bed.     Patient continue to be at high risk for worsening hypoxemic respiratory failure.    Status is: Inpatient  Remains inpatient appropriate because:IV treatments appropriate due to intensity of illness or inability to take PO   Dispo: The patient is from: Home              Anticipated d/c is to: Home              Anticipated d/c date is: > 3 days              Patient currently is not medically stable to d/c.   DVT prophylaxis: Enoxaparin   Code Status:   full  Family Communication:  No family at the bedside      Subjective: Patient continue to have dyspnea, worse with movement, positive cough, no nausea or vomiting.   Objective: Vitals:   02/29/20 0148 02/29/20 0404 02/29/20 0800 02/29/20 0810  BP: 107/68 (!) 98/64 97/65   Pulse:  65 56   Resp:  (!) 30 (!) 25   Temp:  98.7 F (37.1 C)  98 F (36.7 C)  TempSrc:  Oral  Oral  SpO2:  (!) 85% (!) 87%   Weight:      Height:        Intake/Output Summary (Last 24 hours) at 02/29/2020 1013 Last data filed at 02/29/2020 0900 Gross per 24 hour  Intake 340 ml  Output 1050 ml  Net -710 ml  Filed Weights   02/28/20 0057  Weight: 81.2 kg    Examination:   General: deconditioned  Neurology: Awake and alert, non focal  E ENT: mild pallor, no icterus, oral mucosa moist Cardiovascular: No JVD. S1-S2 present, rhythmic, no gallops, rubs, or murmurs. No lower extremity edema. Pulmonary: positive breath sounds bilaterally, Gastrointestinal. Abdomen soft and non tender Skin. No rashes Musculoskeletal: no joint deformities     Data Reviewed: I have personally reviewed following labs and imaging studies  CBC: Recent Labs  Lab 02/28/20 0142 02/28/20 0625  WBC 8.1 10.0  NEUTROABS 6.9  --   HGB 11.6* 12.8*  HCT 35.8* 39.6  MCV 91.3 93.6  PLT 158 149*   Basic  Metabolic Panel: Recent Labs  Lab 02/28/20 0142 02/28/20 0625 02/29/20 0508  NA 134*  --  137  K 4.1  --  4.6  CL 101  --  102  CO2 23  --  27  GLUCOSE 137*  --  187*  BUN 9  --  17  CREATININE 0.84 0.82 0.72  CALCIUM 7.8*  --  8.7*   GFR: Estimated Creatinine Clearance: 117.7 mL/min (by C-G formula based on SCr of 0.72 mg/dL). Liver Function Tests: Recent Labs  Lab 02/28/20 0142 02/29/20 0508  AST 31 53*  ALT 19 41  ALKPHOS 43 52  BILITOT 0.8 0.6  PROT 6.5 6.9  ALBUMIN 2.7* 2.6*   No results for input(s): LIPASE, AMYLASE in the last 168 hours. No results for input(s): AMMONIA in the last 168 hours. Coagulation Profile: No results for input(s): INR, PROTIME in the last 168 hours. Cardiac Enzymes: No results for input(s): CKTOTAL, CKMB, CKMBINDEX, TROPONINI in the last 168 hours. BNP (last 3 results) No results for input(s): PROBNP in the last 8760 hours. HbA1C: No results for input(s): HGBA1C in the last 72 hours. CBG: No results for input(s): GLUCAP in the last 168 hours. Lipid Profile: Recent Labs    02/28/20 0142  TRIG 52   Thyroid Function Tests: No results for input(s): TSH, T4TOTAL, FREET4, T3FREE, THYROIDAB in the last 72 hours. Anemia Panel: Recent Labs    02/28/20 0142 02/29/20 0508  FERRITIN 413* 758*      Radiology Studies: I have reviewed all of the imaging during this hospital visit personally     Scheduled Meds: . albuterol  2 puff Inhalation Q6H  . benzonatate  200 mg Oral TID  . enoxaparin (LOVENOX) injection  40 mg Subcutaneous Q12H  . guaiFENesin-dextromethorphan  10 mL Oral Q6H  . methylPREDNISolone (SOLU-MEDROL) injection  40 mg Intravenous BID   Continuous Infusions: . remdesivir 100 mg in NS 100 mL 100 mg (02/29/20 1001)     LOS: 1 day        Diesha Rostad Annett Gula, MD

## 2020-02-29 NOTE — Evaluation (Signed)
Occupational Therapy Evaluation Patient Details Name: Andrew Allen MRN: [id number] DOB: 21-Feb-1986 Today's Date: 02/29/2020    History of Present Illness 34 y/o presenting with +COVID-19 with fevers, headaches, generalized whole body aches, dizziness, dry persistent coughing, nausea with posttussive emesis, loss of appetite, no taste and shortness of breath. No significant PMH on file   Clinical Impression   PTA pt living with family and functioning at independent communtiy level. At time of eval, pt presents with ability to complete bed mobility at mod I level and sit <> stands at min guard level of assist. Pt was able to don socks seated EOB. He has physical ability to complete BADL, but is largely limited by cardiopulmonary status- requiring increased amount of cues to implement ECS. Pt is on 30L HHFNC + NRB throughout session with SpO2 sats >88%. Instructed pt on pursed lip breathing, pacing self, rest breaks, and IS/flutter usage. OT will continue to follow acutely to progress BADL tolerance and ECS implementation. Anticipate pt will progress well without post acute follow up.    Follow Up Recommendations  No OT follow up    Equipment Recommendations  Tub/shower seat    Recommendations for Other Services       Precautions / Restrictions Precautions Precautions: Other (comment) Precaution Comments: Watch O2 sats Restrictions Weight Bearing Restrictions: No      Mobility Bed Mobility Overal bed mobility: Modified Independent                Transfers Overall transfer level: Needs assistance   Transfers: Sit to/from Stand;Stand Pivot Transfers Sit to Stand: Min guard Stand pivot transfers: Min guard            Balance Overall balance assessment: Mild deficits observed, not formally tested                                         ADL either performed or assessed with clinical judgement   ADL Overall ADL's : Needs  assistance/impaired Eating/Feeding: Independent   Grooming: Supervision/safety;Standing;Sitting Grooming Details (indicate cue type and reason): poor standing tolerance for >2 minutes this date without desaturation Upper Body Bathing: Set up;Sitting   Lower Body Bathing: Set up;Sitting/lateral leans;Sit to/from stand   Upper Body Dressing : Set up;Sitting   Lower Body Dressing: Set up;Sitting/lateral leans;Sit to/from stand Lower Body Dressing Details (indicate cue type and reason): to don socks seated EOB Toilet Transfer: Supervision/safety;Stand-pivot;BSC Toilet Transfer Details (indicate cue type and reason): simulated with chair transfer. Pt is on HHFNC and cannot complete full in room mobility due to limitations with line Toileting- Clothing Manipulation and Hygiene: Set up;Sitting/lateral lean;Sit to/from stand   Tub/ Shower Transfer: Supervision/safety;Ambulation;Shower seat   Functional mobility during ADLs: Min guard General ADL Comments: pt has physical ability to engage in BADL, but is largely limited by cardiopulm status/high O2 needs     Vision Patient Visual Report: No change from baseline       Perception     Praxis      Pertinent Vitals/Pain Pain Assessment: No/denies pain     Hand Dominance     Extremity/Trunk Assessment     Lower Extremity Assessment Lower Extremity Assessment: Generalized weakness       Communication Communication Communication: No difficulties   Cognition Arousal/Alertness: Awake/alert Behavior During Therapy: WFL for tasks assessed/performed Overall Cognitive Status: Within Functional Limits for tasks assessed  General Comments  Pt on 30L HHFNC + NRB with VSS with transfer to chair.    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 [employer]        OT Problem List: Decreased strength;Decreased knowledge of use of DME or AE;Decreased knowledge of precautions;Decreased activity tolerance;Cardiopulmonary status limiting activity      OT Treatment/Interventions: Self-care/ADL training;Therapeutic exercise;Patient/family education;Balance training;Energy conservation;Therapeutic activities;DME and/or AE instruction    OT Goals(Current goals can be found in the care plan section) Acute Rehab OT Goals Patient Stated Goal: return to independence OT Goal Formulation: With patient Time For Goal Achievement: 03/14/20 Potential to Achieve Goals: Good  OT Frequency: Min 3X/week   Barriers to D/C:            Co-evaluation              AM-PAC OT "6 Clicks" Daily Activity     Outcome Measure Help from another person eating meals?: None Help from another person taking care of personal grooming?: A Little Help from another person toileting, which includes using toliet, bedpan, or urinal?: A Little Help from another person bathing (including washing, rinsing, drying)?: A Little Help from another person to put on and taking off regular upper body clothing?: None Help from another person to put on and taking off regular lower body clothing?: A Little 6 Click Score: 20   End of Session Equipment Utilized During Treatment: Oxygen Nurse Communication: Mobility status  Activity Tolerance: Patient tolerated treatment well Patient left: in chair;with call bell/phone within reach  OT Visit Diagnosis: Other abnormalities of gait and mobility  (R26.89);Other (comment) (cardiopulmonary status)                Time: 9622-2979 OT Time Calculation (min): 21 min Charges:  OT General Charges $OT Visit: 1 Visit OT Evaluation $OT Eval Moderate Complexity: 1 Mod  Dalphine Handing, MSOT, OTR/L Acute Rehabilitation Services [facility] Office Number: [phone] Pager: [phone]  Dalphine Handing 02/29/2020, 1:05 PM

## 2020-03-01 DIAGNOSIS — U071 COVID-19: Secondary | ICD-10-CM

## 2020-03-01 DIAGNOSIS — J1282 Pneumonia due to coronavirus disease 2019: Secondary | ICD-10-CM

## 2020-03-01 HISTORY — DX: Pneumonia due to coronavirus disease 2019: J12.82

## 2020-03-01 LAB — COMPREHENSIVE METABOLIC PANEL
ALT: 60 U/L — ABNORMAL HIGH (ref 0–44)
AST: 58 U/L — ABNORMAL HIGH (ref 15–41)
Albumin: 2.6 g/dL — ABNORMAL LOW (ref 3.5–5.0)
Alkaline Phosphatase: 59 U/L (ref 38–126)
Anion gap: 9 (ref 5–15)
BUN: 16 mg/dL (ref 6–20)
CO2: 27 mmol/L (ref 22–32)
Calcium: 8.5 mg/dL — ABNORMAL LOW (ref 8.9–10.3)
Chloride: 101 mmol/L (ref 98–111)
Creatinine, Ser: 0.71 mg/dL (ref 0.61–1.24)
GFR calc Af Amer: >60 mL/min (ref >60)
GFR calc non Af Amer: >60 mL/min (ref >60)
Glucose, Bld: 199 mg/dL — ABNORMAL HIGH (ref 70–99)
Potassium: 4.5 mmol/L (ref 3.5–5.1)
Sodium: 137 mmol/L (ref 135–145)
Total Bilirubin: 0.5 mg/dL (ref 0.3–1.2)
Total Protein: 6.5 g/dL (ref 6.5–8.1)

## 2020-03-01 LAB — FERRITIN: Ferritin: 621 ng/mL — ABNORMAL HIGH (ref 24–336)

## 2020-03-01 LAB — C-REACTIVE PROTEIN: CRP: 10.4 mg/dL — ABNORMAL HIGH (ref <1.0)

## 2020-03-01 LAB — D-DIMER, QUANTITATIVE: D-Dimer, Quant: 0.59 ug/mL-FEU — ABNORMAL HIGH (ref 0.00–0.50)

## 2020-03-01 NOTE — Progress Notes (Signed)
   03/01/20 0728  Assess: MEWS Score  Temp 97.6 F (36.4 C)  BP (!) 101/63  Pulse Rate 61  ECG Heart Rate 62  Resp (!) 26  Level of Consciousness Alert  SpO2 90 %  O2 Device Non-rebreather Mask;HFNC  Patient Activity (if Appropriate) In chair  O2 Flow Rate (L/min) 35 L/min  Assess: MEWS Score  MEWS Temp 0  MEWS Systolic 0  MEWS Pulse 0  MEWS RR 2  MEWS LOC 0  MEWS Score 2  MEWS Score Color Yellow  Assess: if the MEWS score is Yellow or Red  Were vital signs taken at a resting state? Yes  Focused Assessment No change from prior assessment  Early Detection of Sepsis Score *See Row Information* Low  MEWS guidelines implemented *See Row Information* No, previously yellow, continue vital signs every 4 hours  Treat  Pain Scale 0-10  Take Vital Signs  Increase Vital Sign Frequency  Yellow: Q 2hr X 2 then Q 4hr X 2, if remains yellow, continue Q 4hrs  Escalate  MEWS: Escalate Yellow: discuss with charge nurse/RN and consider discussing with provider and RRT  Notify: Charge Nurse/RN  Name of Charge Nurse/RN Notified Erin RN  Date Charge Nurse/RN Notified 03/01/20  Time Charge Nurse/RN Notified 0730  Document  Patient Outcome Other (Comment) (pt stable up from bed to the chair, )  Progress note created (see row info) Yes   Patient MEWS is yellow due to increased respirations currently on the heated high flow N/C at 35 L of oxygen, patient reposition in the chair and educated on flutter valve ans IS use, will continue to monitor.

## 2020-03-01 NOTE — Progress Notes (Signed)
Patient up from bed in the chair currently on 25 L on Heated high flow N/C, oxygen sat 93%. No complaints of SOB or respiratory distress. Patient educated on flutter valve and incentive spirometry. Will continue to monitor.

## 2020-03-01 NOTE — Progress Notes (Addendum)
PROGRESS NOTE    Andrew Allen  DUK:[id number] DOB: 12/28/1985 DOA: 02/28/2020 PCP: Julieanne Manson, MD    Brief Narrative:  Patient admitted to the hospital with working diagnosis of acute hypoxic respiratory failure due to SARS COVID-19 viral pneumonia.  34 year old male with no significant past medical history.  Patient reported 8 days of generalized malaise, body aches, dizziness, dry cough, nausea, loss of appetite, sense of smell and taste.  He had worsening dyspnea decided to come to the hospital for further evaluation.  On his initial physical examination his oximetry on room air on ambulation was 60%.  His lungs were clear to auscultation bilaterally, heart S1-S2, present rhythmic, abdomen soft, no lower extremity edema. Sodium 134, potassium 4.1, chloride 101, bicarb 23, glucose 137, BUN 9, creatinine 0.84, white count 8.1, hemoglobin 9.6, hematocrit 35.8, platelets 158.  SARS COVID-19 positive. Chest radiograph had bilateral interstitial infiltrates, left upper lobe (predominant), right upper lobe, right lower lobe.  Patient has been placed on systemic steroids, remdesivir and received 1 dose of Tocilizumab.   Assessment & Plan:   Principal Problem:   Acute respiratory failure due to COVID-19 [facility]) Active Problems:   Pneumonia due to COVID-19 virus   1.  Acute hypoxic respiratory failure due to SARS COVID-19 viral pneumonia. Sp Tocilizumab 07/31  RR: 28  Pulse oxymetry: 98%  Fi02: 40% Fi02 100   COVID-19 Labs  Recent Labs    02/28/20 0142 02/29/20 0508 03/01/20 0420  DDIMER 1.20* 0.89* 0.59*  FERRITIN 413* 758* 621*  LDH 365*  --   --   CRP 19.7* 21.8* 10.4*    Lab Results  Component Value Date   SARSCOV2NAA POSITIVE (A) 02/24/2020   His Fi02 was reduced to 80% this am with good toleration, oxygen saturation about 94 to 95%. Continue with 30 L/min flow. Inflammatory markers are now trending down.   Continue medical therapy with systemic  steroids, methylprednisolone 40 mg IV q 12 H. Remdesivir #3/5 AST 58 and ALT 60  On bronchodilator therapy, antitussive agents and airway clearing technique with incentive spirometer and flutter valve.   He has been out of bed and attempting prone position while in bed.   Patient continue to be at high risk for worsening hypoxemic respiratory failure.   Status is: Inpatient  Remains inpatient appropriate because:Hemodynamically unstable and IV treatments appropriate due to intensity of illness or inability to take PO   Dispo: The patient is from: Home              Anticipated d/c is to: Home              Anticipated d/c date is: > 3 days              Patient currently is not medically stable to d/c.   DVT prophylaxis: Enoxaparin   Code Status:   full  Family Communication:  No family at the bedside      Subjective: Patient continue to have significant dyspnea, but improved from yesterday, worse with exertion. This am is out of bed to the chair, with no nausea or vomiting, no chest pain.   Objective: Vitals:   03/01/20 0439 03/01/20 0441 03/01/20 0728 03/01/20 0933  BP: 115/71  (!) 101/63 (!) 101/63  Pulse: 65  61 61  Resp: (!) 38  (!) 26 (!) 28  Temp:   97.6 F (36.4 C) 97.9 F (36.6 C)  TempSrc:   Oral Oral  SpO2: 92% 91% 90% 98%  Weight:  Height:        Intake/Output Summary (Last 24 hours) at 03/01/2020 1013 Last data filed at 03/01/2020 0900 Gross per 24 hour  Intake 240 ml  Output 850 ml  Net -610 ml   Filed Weights   02/28/20 0057  Weight: 81.2 kg    Examination:   General: deconditioned, positive dyspnea at rest.  Neurology: Awake and alert, non focal  E ENT: milf pallor, no icterus, oral mucosa moist Cardiovascular: No JVD. S1-S2 present, rhythmic, no gallops, rubs, or murmurs. Trace lower extremity edema. Pulmonary: positive breath sounds bilaterally, Gastrointestinal. Abdomen soft and non tender Skin. No rashes Musculoskeletal: no joint  deformities     Data Reviewed: I have personally reviewed following labs and imaging studies  CBC: Recent Labs  Lab 02/28/20 0142 02/28/20 0625  WBC 8.1 10.0  NEUTROABS 6.9  --   HGB 11.6* 12.8*  HCT 35.8* 39.6  MCV 91.3 93.6  PLT 158 149*   Basic Metabolic Panel: Recent Labs  Lab 02/28/20 0142 02/28/20 0625 02/29/20 0508 03/01/20 0420  NA 134*  --  137 137  K 4.1  --  4.6 4.5  CL 101  --  102 101  CO2 23  --  27 27  GLUCOSE 137*  --  187* 199*  BUN 9  --  17 16  CREATININE 0.84 0.82 0.72 0.71  CALCIUM 7.8*  --  8.7* 8.5*   GFR: Estimated Creatinine Clearance: 117.7 mL/min (by C-G formula based on SCr of 0.71 mg/dL). Liver Function Tests: Recent Labs  Lab 02/28/20 0142 02/29/20 0508 03/01/20 0420  AST 31 53* 58*  ALT 19 41 60*  ALKPHOS 43 52 59  BILITOT 0.8 0.6 0.5  PROT 6.5 6.9 6.5  ALBUMIN 2.7* 2.6* 2.6*   No results for input(s): LIPASE, AMYLASE in the last 168 hours. No results for input(s): AMMONIA in the last 168 hours. Coagulation Profile: No results for input(s): INR, PROTIME in the last 168 hours. Cardiac Enzymes: No results for input(s): CKTOTAL, CKMB, CKMBINDEX, TROPONINI in the last 168 hours. BNP (last 3 results) No results for input(s): PROBNP in the last 8760 hours. HbA1C: No results for input(s): HGBA1C in the last 72 hours. CBG: No results for input(s): GLUCAP in the last 168 hours. Lipid Profile: Recent Labs    02/28/20 0142  TRIG 52   Thyroid Function Tests: No results for input(s): TSH, T4TOTAL, FREET4, T3FREE, THYROIDAB in the last 72 hours. Anemia Panel: Recent Labs    02/29/20 0508 03/01/20 0420  FERRITIN 758* 621*      Radiology Studies: I have reviewed all of the imaging during this hospital visit personally     Scheduled Meds: . albuterol  2 puff Inhalation Q6H  . chlorpheniramine-HYDROcodone  5 mL Oral Q12H  . enoxaparin (LOVENOX) injection  40 mg Subcutaneous Q12H  . guaiFENesin-dextromethorphan  10  mL Oral Q6H  . methylPREDNISolone (SOLU-MEDROL) injection  40 mg Intravenous BID   Continuous Infusions: . remdesivir 100 mg in NS 100 mL 100 mg (03/01/20 0901)     LOS: 2 days        Andrew Kelsay Annett Gula, MD

## 2020-03-02 LAB — COMPREHENSIVE METABOLIC PANEL
ALT: 89 U/L — ABNORMAL HIGH (ref 0–44)
AST: 44 U/L — ABNORMAL HIGH (ref 15–41)
Albumin: 2.7 g/dL — ABNORMAL LOW (ref 3.5–5.0)
Alkaline Phosphatase: 60 U/L (ref 38–126)
Anion gap: 7 (ref 5–15)
BUN: 17 mg/dL (ref 6–20)
CO2: 28 mmol/L (ref 22–32)
Calcium: 8.4 mg/dL — ABNORMAL LOW (ref 8.9–10.3)
Chloride: 100 mmol/L (ref 98–111)
Creatinine, Ser: 0.75 mg/dL (ref 0.61–1.24)
GFR calc Af Amer: >60 mL/min (ref >60)
GFR calc non Af Amer: >60 mL/min (ref >60)
Glucose, Bld: 128 mg/dL — ABNORMAL HIGH (ref 70–99)
Potassium: 4.3 mmol/L (ref 3.5–5.1)
Sodium: 135 mmol/L (ref 135–145)
Total Bilirubin: 1 mg/dL (ref 0.3–1.2)
Total Protein: 6.5 g/dL (ref 6.5–8.1)

## 2020-03-02 LAB — C-REACTIVE PROTEIN: CRP: 5.5 mg/dL — ABNORMAL HIGH (ref <1.0)

## 2020-03-02 LAB — FERRITIN: Ferritin: 332 ng/mL (ref 24–336)

## 2020-03-02 LAB — D-DIMER, QUANTITATIVE: D-Dimer, Quant: 0.8 ug/mL-FEU — ABNORMAL HIGH (ref 0.00–0.50)

## 2020-03-02 NOTE — Progress Notes (Addendum)
Physical Therapy Treatment Patient Details Name: Andrew Allen MRN: [id number] DOB: June 12, 1986 Today's Date: 03/02/2020    History of Present Illness 34 y/o presenting with +COVID-19 with fevers, headaches, generalized whole body aches, dizziness, dry persistent coughing, nausea with posttussive emesis, loss of appetite, no taste and shortness of breath. No significant PMH on file    PT Comments    Pt weaned to 12L O2 just prior to PT session. Pt's RN, Elita Quick, provided language interpretation at beginning of session. He requires min guard assist transfers. LE exercises performed in recliner. Session limited by resp status/activity tolerance. Pt fatigues quickly. SpO2 90% at rest on 12L. Desat to 82% during exercise, requiring addition of 15L NRB for recovery to 95%. NRB removed. Pt able to maintain SpO2 of 90% on 12L. Pt remained in recliner with feet elevated at end of session.    Follow Up Recommendations  No PT follow up     Equipment Recommendations  None recommended by PT    Recommendations for Other Services       Precautions / Restrictions Precautions Precautions: Other (comment) Precaution Comments: Watch O2 sats    Mobility  Bed Mobility Overal bed mobility: Modified Independent                Transfers Overall transfer level: Needs assistance Equipment used: None Transfers: Sit to/from [employer] Transfers Sit to Stand: Min guard Stand pivot transfers: Min guard       General transfer comment: min guard assist for safety/lines  Ambulation/Gait                 Stairs             Wheelchair Mobility    Modified Rankin (Stroke Patients Only)       Balance Overall balance assessment: Mild deficits observed, not formally tested                                          Cognition Arousal/Alertness: Awake/alert Behavior During Therapy: WFL for tasks assessed/performed Overall Cognitive Status: Within  Functional Limits for tasks assessed                                 General Comments: mildly flat. Does not interact very much.      Exercises General Exercises - Lower Extremity Ankle Circles/Pumps: AROM;Both;10 reps;Supine Long Arc Quad: AROM;Right;Left;10 reps;Seated Heel Slides: AROM;Right;Left;10 reps;Supine Hip ABduction/ADduction: AROM;Both;10 reps;Supine    General Comments General comments (skin integrity, edema, etc.): Pt on 12L HHFNC, SpO2 90% at rest. Addition of 15L NRB required during exercise for recovery due to desat to 82%. SpO2 recovered to 95% and NRB removed.      Pertinent Vitals/Pain Pain Assessment: No/denies pain    Home Living                      Prior Function            PT Goals (current goals can now be found in the care plan section) Acute Rehab PT Goals Patient Stated Goal: return to independence Progress towards PT goals: Progressing toward goals    Frequency    Min 3X/week      PT Plan Current plan remains appropriate    Co-evaluation  AM-PAC PT "6 Clicks" Mobility   Outcome Measure  Help needed turning from your back to your side while in a flat bed without using bedrails?: None Help needed moving from lying on your back to sitting on the side of a flat bed without using bedrails?: None Help needed moving to and from a bed to a chair (including a wheelchair)?: A Little Help needed standing up from a chair using your arms (e.g., wheelchair or bedside chair)?: A Little Help needed to walk in hospital room?: A Little Help needed climbing 3-5 steps with a railing? : A Little 6 Click Score: 20    End of Session Equipment Utilized During Treatment: Oxygen Activity Tolerance: Other (comment) (resp status/O2 needs) Patient left: in chair;with call bell/phone within reach Nurse Communication: Mobility status PT Visit Diagnosis: Difficulty in walking, not elsewhere classified (R26.2);Muscle  weakness (generalized) (M62.81);Unsteadiness on feet (R26.81)     Time: 0160-1093 PT Time Calculation (min) (ACUTE ONLY): 13 min  Charges:  $Therapeutic Exercise: 8-22 mins                     [name], PT  Office # [phone] Pager [phone]    [name] 03/02/2020, 11:38 AM

## 2020-03-02 NOTE — Progress Notes (Signed)
   03/02/20 2001  Assess: MEWS Score  Temp 98.2 F (36.8 C)  BP 114/68  Pulse Rate 76  ECG Heart Rate 74  Resp (!) 26  Level of Consciousness Alert  SpO2 91 %  O2 Device HFNC  Patient Activity (if Appropriate) In bed  O2 Flow Rate (L/min) 12 L/min  Assess: MEWS Score  MEWS Temp 0  MEWS Systolic 0  MEWS Pulse 0  MEWS RR 2  MEWS LOC 0  MEWS Score 2  MEWS Score Color Yellow  Assess: if the MEWS score is Yellow or Red  Were vital signs taken at a resting state? Yes  Focused Assessment No change from prior assessment  Early Detection of Sepsis Score *See Row Information* Low  MEWS guidelines implemented *See Row Information* Yes  Treat  Pain Scale 0-10  Pain Score 0  Take Vital Signs  Increase Vital Sign Frequency  Yellow: Q 2hr X 2 then Q 4hr X 2, if remains yellow, continue Q 4hrs  Escalate  MEWS: Escalate Yellow: discuss with charge nurse/RN and consider discussing with provider and RRT  Notify: Charge Nurse/RN  Name of Charge Nurse/RN Notified Reuel Derby  Date Charge Nurse/RN Notified 03/02/20  Time Charge Nurse/RN Notified 2015  Notify: Rapid Response  Name of Rapid Response RN Notified Dave R.,RN  Date Rapid Response Notified 03/02/20  Time Rapid Response Notified 2330  Document  Patient Outcome Other (Comment) (continues to be tachypneic, but stable)  Progress note created (see row info) Yes   Patient fired a yellow MEWS after moving from the chair to the bed. MEWS protocol implemented. Patient is tachypneic, but is not showing any signs of distress. Patient is currently on 15 liters of O2 via HFNC and a non-rebreather. Will continue to monitor and treat per MD orders.

## 2020-03-02 NOTE — Progress Notes (Signed)
PROGRESS NOTE    Andrew Allen  WGN:[id number] DOB: 12/17/1985 DOA: 02/28/2020 PCP: Julieanne Manson, MD    Brief Narrative:  Patient admitted to the hospital with working diagnosis of acute hypoxic respiratory failure due to SARS COVID-19 viral pneumonia.  34 year old male with no significant past medical history. Patient reported 8 days of generalized malaise, body aches, dizziness, dry cough, nausea, loss of appetite, sense of smell and taste. He had worsening dyspnea decided to come to the hospital for further evaluation. On his initial physical examination his oximetry on room air on ambulation was 60%. His lungs were clear to auscultation bilaterally, heart S1-S2, present rhythmic, abdomen soft, no lower extremity edema. Sodium 134, potassium 4.1, chloride 101, bicarb 23, glucose 137, BUN 9, creatinine 0.84, white count 8.1, hemoglobin 9.6, hematocrit 35.8, platelets 158. SARS COVID-19 positive. Chest radiograph had bilateral interstitial infiltrates, left upper lobe(predominant), right upper lobe, right lower lobe.  Patient has been placed on systemic steroids, remdesivir and received 1 dose of Tocilizumab.  He continue to have very high oxygen requirements.    Assessment & Plan:   Principal Problem:   Acute respiratory failure due to COVID-19 [facility]) Active Problems:   Pneumonia due to COVID-19 virus   1.Acute hypoxic respiratory failure due to SARS COVID-19 viral pneumonia. Sp Tocilizumab 07/31  RR: 18  Pulse oxymetry: 91 to 92%  Fi02: 13 L/ min per regular HFNC 80% Fi02.  COVID-19 Labs  Recent Labs    02/29/20 0508 03/01/20 0420 03/02/20 0500  DDIMER 0.89* 0.59* 0.80*  FERRITIN 758* 621* 332  CRP 21.8* 10.4* 5.5*    Lab Results  Component Value Date   SARSCOV2NAA POSITIVE (A) 02/24/2020    Inflammatory markers are trending down.   This am able to do physical therapy with 12 to 15 L/ min per high flow nasal cannula. His dyspnea is  getting better, but not yet back to baseline.   Tolerating well medical therapy with systemic steroids, methylprednisolone 40 mg IV q 12 H. Remdesivir #4/5 (AST 44 and ALT 89)  Continue with bronchodilator therapy, antitussive agents and airway clearing technique with incentive spirometer and flutter valve.    Status is: Inpatient  Remains inpatient appropriate because:Inpatient level of care appropriate due to severity of illness   Dispo: The patient is from: Home              Anticipated d/c is to: Home              Anticipated d/c date is: 3 days              Patient currently is not medically stable to d/c.    DVT prophylaxis: Enoxaparin   Code Status:   full  Family Communication:  No family at the bedside, patient is in contact with his family by phone.       Subjective: Patient is feeling better, but continue to have with dyspnea, not yet back to baseline, no nausea or vomiting, continue to have poor oral intake.   Objective: Vitals:   03/01/20 1941 03/02/20 0113 03/02/20 0400 03/02/20 0856  BP: 99/68 102/66 109/69   Pulse:  (!) 57 (!) 57   Resp: 17 20 18    Temp: 97.7 F (36.5 C) 98.2 F (36.8 C) 97.9 F (36.6 C)   TempSrc: Oral Oral Oral   SpO2:  95% 91% 92%  Weight:      Height:        Intake/Output Summary (Last 24 hours) at  03/02/2020 0941 Last data filed at 03/02/2020 0708 Gross per 24 hour  Intake 120 ml  Output 1650 ml  Net -1530 ml   Filed Weights   02/28/20 0057  Weight: 81.2 kg    Examination:   General: Not in pain or dyspnea, deconditioned  Neurology: Awake and alert, non focal  E ENT: mild pallor, no icterus, oral mucosa moist Cardiovascular: No JVD. S1-S2 present, rhythmic, no gallops, rubs, or murmurs. No lower extremity edema. Pulmonary: positive breath sounds bilaterally, no wheezing Gastrointestinal. Abdomen soft and non tender Skin. No rashes Musculoskeletal: no joint deformities     Data Reviewed: I have personally  reviewed following labs and imaging studies  CBC: Recent Labs  Lab 02/28/20 0142 02/28/20 0625  WBC 8.1 10.0  NEUTROABS 6.9  --   HGB 11.6* 12.8*  HCT 35.8* 39.6  MCV 91.3 93.6  PLT 158 149*   Basic Metabolic Panel: Recent Labs  Lab 02/28/20 0142 02/28/20 0625 02/29/20 0508 03/01/20 0420 03/02/20 0500  NA 134*  --  137 137 135  K 4.1  --  4.6 4.5 4.3  CL 101  --  102 101 100  CO2 23  --  27 27 28   GLUCOSE 137*  --  187* 199* 128*  BUN 9  --  17 16 17   CREATININE 0.84 0.82 0.72 0.71 0.75  CALCIUM 7.8*  --  8.7* 8.5* 8.4*   GFR: Estimated Creatinine Clearance: 117.7 mL/min (by C-G formula based on SCr of 0.75 mg/dL). Liver Function Tests: Recent Labs  Lab 02/28/20 0142 02/29/20 0508 03/01/20 0420 03/02/20 0500  AST 31 53* 58* 44*  ALT 19 41 60* 89*  ALKPHOS 43 52 59 60  BILITOT 0.8 0.6 0.5 1.0  PROT 6.5 6.9 6.5 6.5  ALBUMIN 2.7* 2.6* 2.6* 2.7*   No results for input(s): LIPASE, AMYLASE in the last 168 hours. No results for input(s): AMMONIA in the last 168 hours. Coagulation Profile: No results for input(s): INR, PROTIME in the last 168 hours. Cardiac Enzymes: No results for input(s): CKTOTAL, CKMB, CKMBINDEX, TROPONINI in the last 168 hours. BNP (last 3 results) No results for input(s): PROBNP in the last 8760 hours. HbA1C: No results for input(s): HGBA1C in the last 72 hours. CBG: No results for input(s): GLUCAP in the last 168 hours. Lipid Profile: No results for input(s): CHOL, HDL, LDLCALC, TRIG, CHOLHDL, LDLDIRECT in the last 72 hours. Thyroid Function Tests: No results for input(s): TSH, T4TOTAL, FREET4, T3FREE, THYROIDAB in the last 72 hours. Anemia Panel: Recent Labs    03/01/20 0420 03/02/20 0500  FERRITIN 621* 332      Radiology Studies: I have reviewed all of the imaging during this hospital visit personally     Scheduled Meds: . albuterol  2 puff Inhalation Q6H  . chlorpheniramine-HYDROcodone  5 mL Oral Q12H  . enoxaparin  (LOVENOX) injection  40 mg Subcutaneous Q12H  . guaiFENesin-dextromethorphan  10 mL Oral Q6H  . methylPREDNISolone (SOLU-MEDROL) injection  40 mg Intravenous BID   Continuous Infusions: . remdesivir 100 mg in NS 100 mL 100 mg (03/02/20 0902)     LOS: 3 days        Cesar Alf 05/02/20, MD

## 2020-03-02 NOTE — Progress Notes (Signed)
Occupational Therapy Treatment Patient Details Name: Andrew Allen MRN: [id number] DOB: 01-19-1986 Today's Date: 03/02/2020    History of present illness 34 y/o presenting with +COVID-19 with fevers, headaches, generalized whole body aches, dizziness, dry persistent coughing, nausea with posttussive emesis, loss of appetite, no taste and shortness of breath. No significant PMH on file   OT comments  Patient up in chair on arrival.  Provided education on energy conservation and what to do to increase activity toolerance.  Limited session mostly to seated exercises as after 3 min stand and short walk with min guard, patient feeling dizzy and BP decreased.  Practiced UE theraband exercises as well as breathing exercises.  Patient on 12L O2 throughout treatment with SpO2 90 at rest.  Decreased to 86 after walk though able to recover quickly.  Will continue to follow with OT acutely to address the deficits listed below.   BP: Sit - 102/67 0 min Stand - 83/64 3 min stand - 95/64 3 min sit - 108/66  Follow Up Recommendations  No OT follow up    Equipment Recommendations  Tub/shower seat    Recommendations for Other Services      Precautions / Restrictions Precautions Precautions: Other (comment) Precaution Comments: Watch O2 sats       Mobility Bed Mobility Overal bed mobility: Modified Independent             General bed mobility comments: Up in chair  Transfers Overall transfer level: Needs assistance Equipment used: None Transfers: Sit to/from Stand;Stand Pivot Transfers Sit to Stand: Min guard Stand pivot transfers: Min guard       General transfer comment: min guard assist for safety/lines    Balance Overall balance assessment: Mild deficits observed, not formally tested                                         ADL either performed or assessed with clinical judgement   ADL                                        Functional mobility during ADLs: Min guard General ADL Comments: Session focused on increasing activity tolerance and energy conservation. Limited by low BP     Vision       Perception     Praxis      Cognition Arousal/Alertness: Awake/alert Behavior During Therapy: WFL for tasks assessed/performed Overall Cognitive Status: Within Functional Limits for tasks assessed                                 General Comments: mildly flat. Does not interact very much.        Exercises Exercises: Other exercises;General Upper Extremity General Exercises - Upper Extremity Shoulder Flexion: AROM;Strengthening;Both;15 reps;Seated;Theraband Theraband Level (Shoulder Flexion): Level 1 (Yellow) Shoulder Extension: AROM;Strengthening;Both;10 reps;Seated;Theraband Theraband Level (Shoulder Extension): Level 1 (Yellow) Shoulder Horizontal ABduction: AROM;Strengthening;Both;10 reps;Seated;Theraband Theraband Level (Shoulder Horizontal Abduction): Level 1 (Yellow) Shoulder Horizontal ADduction: AROM;Strengthening;Both;10 reps;Seated;Theraband Theraband Level (Shoulder Horizontal Adduction): Level 1 (Yellow) Elbow Extension: AROM;Strengthening;Both;15 reps;Seated;Theraband Theraband Level (Elbow Extension): Level 1 (Yellow) Wrist Flexion: AROM;Strengthening;Both;15 reps;Seated;Theraband Theraband Level (Wrist Flexion): Level 1 (Yellow) General Exercises - Lower Extremity Ankle Circles/Pumps: AROM;Both;10 reps;Supine Long Arc Quad: AROM;Right;Left;10 reps;Seated Heel Slides: AROM;Right;Left;10  reps;Supine Hip ABduction/ADduction: AROM;Both;10 reps;Supine Other Exercises Other Exercises: 1x 3 min stand and walk 15 ft Other Exercises: x10 flutter valve Other Exercises: x10 IS (pulled 500)   Shoulder Instructions       General Comments BP orthostatic and patient symptomatic (see clinical impression).  Interpreter Mathis Fare [phone] used     Pertinent Vitals/ Pain       Pain  Assessment: No/denies pain  Home Living                                          Prior Functioning/Environment              Frequency  Min 3X/week        Progress Toward Goals  OT Goals(current goals can now be found in the care plan section)  Progress towards OT goals: Progressing toward goals  Acute Rehab OT Goals Patient Stated Goal: return to independence OT Goal Formulation: With patient Time For Goal Achievement: 03/14/20 Potential to Achieve Goals: Good  Plan Discharge plan remains appropriate    Co-evaluation                 AM-PAC OT "6 Clicks" Daily Activity     Outcome Measure   Help from another person eating meals?: None Help from another person taking care of personal grooming?: A Little Help from another person toileting, which includes using toliet, bedpan, or urinal?: A Little Help from another person bathing (including washing, rinsing, drying)?: A Little Help from another person to put on and taking off regular upper body clothing?: None Help from another person to put on and taking off regular lower body clothing?: A Little 6 Click Score: 20    End of Session Equipment Utilized During Treatment: Oxygen  OT Visit Diagnosis: Other abnormalities of gait and mobility (R26.89);Other (comment) (cardiopulminary deficits)   Activity Tolerance Patient tolerated treatment well   Patient Left in chair;with call bell/phone within reach   Nurse Communication Mobility status        Time: 2778-2423 OT Time Calculation (min): 26 min  Charges: OT General Charges $OT Visit: 1 Visit OT Treatments $Therapeutic Activity: 8-22 mins $Therapeutic Exercise: 8-22 mins  Barbie Banner, OTR/L    Adella Hare 03/02/2020, 2:59 PM

## 2020-03-02 NOTE — Progress Notes (Signed)
Patient up from bed and in the chair oxygen sat 93% on 6 L HFNC, tolerating it well. Flutter valve and IS teaching. No complaints of SOB or discomfort at this time.

## 2020-03-03 LAB — COMPREHENSIVE METABOLIC PANEL
ALT: 94 U/L — ABNORMAL HIGH (ref 0–44)
AST: 36 U/L (ref 15–41)
Albumin: 3 g/dL — ABNORMAL LOW (ref 3.5–5.0)
Alkaline Phosphatase: 65 U/L (ref 38–126)
Anion gap: 8 (ref 5–15)
BUN: 13 mg/dL (ref 6–20)
CO2: 29 mmol/L (ref 22–32)
Calcium: 8.6 mg/dL — ABNORMAL LOW (ref 8.9–10.3)
Chloride: 94 mmol/L — ABNORMAL LOW (ref 98–111)
Creatinine, Ser: 0.78 mg/dL (ref 0.61–1.24)
GFR calc Af Amer: >60 mL/min (ref >60)
GFR calc non Af Amer: >60 mL/min (ref >60)
Glucose, Bld: 199 mg/dL — ABNORMAL HIGH (ref 70–99)
Potassium: 4.5 mmol/L (ref 3.5–5.1)
Sodium: 131 mmol/L — ABNORMAL LOW (ref 135–145)
Total Bilirubin: 1 mg/dL (ref 0.3–1.2)
Total Protein: 7.1 g/dL (ref 6.5–8.1)

## 2020-03-03 LAB — D-DIMER, QUANTITATIVE: D-Dimer, Quant: 1.19 ug/mL-FEU — ABNORMAL HIGH (ref 0.00–0.50)

## 2020-03-03 LAB — C-REACTIVE PROTEIN: CRP: 3.2 mg/dL — ABNORMAL HIGH (ref <1.0)

## 2020-03-03 LAB — FERRITIN: Ferritin: 265 ng/mL (ref 24–336)

## 2020-03-03 MED ORDER — ALBUTEROL SULFATE HFA 108 (90 BASE) MCG/ACT IN AERS
2.0000 | INHALATION_SPRAY | RESPIRATORY_TRACT | Status: DC | PRN
  Administered 2020-03-04 – 2020-03-06 (×2): 2 via RESPIRATORY_TRACT
  Filled 2020-03-03: qty 6.7

## 2020-03-03 MED ORDER — IPRATROPIUM-ALBUTEROL 20-100 MCG/ACT IN AERS
1.0000 | INHALATION_SPRAY | Freq: Four times a day (QID) | RESPIRATORY_TRACT | Status: DC
  Administered 2020-03-03 – 2020-03-07 (×16): 1 via RESPIRATORY_TRACT
  Filled 2020-03-03: qty 4

## 2020-03-03 NOTE — Progress Notes (Signed)
Physical Therapy Treatment Patient Details Name: Andrew Allen MRN: [id number] DOB: 1986/06/26 Today's Date: 03/03/2020    History of Present Illness 34 y/o presenting with +COVID-19 with fevers, headaches, generalized whole body aches, dizziness, dry persistent coughing, nausea with posttussive emesis, loss of appetite, no taste and shortness of breath. No significant PMH on file    PT Comments    Pt was able to participate in PT session today with the assistance of an interpreter via interpreter tablet.  He was sating well on 12 L O2 HFNC at rest in the mid 90s.  The first bout of standing exercises he desaturated to 88% however, he felt to be in distress (RR increased from 20 to 45) and he felt the need to donn the NRB mask as well for recovery.  Second bout of exercise we wore the NRB mask during the activity and he tolerated it better, still needing 3-5 mins to recover RR, not necessarily O2 saturations.  At the end of the session I left him on 12 L O2 HFNC only (NRB mask off). We reviewed standing and seated exercises as well as breathing exercises.    Follow Up Recommendations  No PT follow up     Equipment Recommendations  Other (comment) (possible home O2)    Recommendations for Other Services OT consult     Precautions / Restrictions Precautions Precautions: Other (comment) Precaution Comments: Watch O2 sats on lots of O2    Mobility  Bed Mobility               General bed mobility comments: Pt was OOB in the recliner chair.   Transfers Overall transfer level: Needs assistance Equipment used: None   Sit to Stand: Supervision         General transfer comment: Supervision for safety and line management.    Ambulation/Gait                 Stairs             Wheelchair Mobility    Modified Rankin (Stroke Patients Only)       Balance Overall balance assessment: Needs assistance Sitting-balance support: Feet supported;No upper  extremity supported Sitting balance-Leahy Scale: Good     Standing balance support: No upper extremity supported Standing balance-Leahy Scale: Good Standing balance comment: close supervision in standing for line management. Stood and did exercises until fatigue x 2 then did seated exercises and breathing exercises in sitting.                             Cognition Arousal/Alertness: Awake/alert Behavior During Therapy: WFL for tasks assessed/performed Overall Cognitive Status: Within Functional Limits for tasks assessed                                        Exercises General Exercises - Upper Extremity Shoulder Flexion: AROM;Both;10 reps;Seated (inhale with bil shoulder flex, exhale with return to rest) General Exercises - Lower Extremity Hip Flexion/Marching: AROM;Both;20 reps;Standing Mini-Sqauts: AROM;Both;10 reps;Standing Other Exercises Other Exercises: incentive spirometer and flutter valve x 10 each, max inspired volume was 500 mL, cues for technique.     General Comments General comments (skin integrity, edema, etc.): Pt on 12 L O2 HFNC at rest with sats in the low 90s, with standing exercises decreased to 88% on 12 L O2 HFNC, however,  RR increased to the mid 40s and pt started dry coughing.  He reached for and donned NRB mask which was set at 15L.  RR would slow and become more normal withing 3-5 mins.  O2 sats would rebound to mid 90s and he would take NRB off.  After first bout of exercise, we donned NRB for increased standing activity tolerance.  At the end of session he was back in the recliner chair and on 12 L HFNC only sating in the mid 90s RR in the 20s.      Pertinent Vitals/Pain Pain Assessment: No/denies pain    Home Living                      Prior Function            PT Goals (current goals can now be found in the care plan section) Acute Rehab PT Goals Patient Stated Goal: return to independence Progress towards PT  goals: Progressing toward goals    Frequency    Min 3X/week      PT Plan Current plan remains appropriate    Co-evaluation              AM-PAC PT "6 Clicks" Mobility   Outcome Measure  Help needed turning from your back to your side while in a flat bed without using bedrails?: None Help needed moving from lying on your back to sitting on the side of a flat bed without using bedrails?: None Help needed moving to and from a bed to a chair (including a wheelchair)?: None Help needed standing up from a chair using your arms (e.g., wheelchair or bedside chair)?: None Help needed to walk in hospital room?: None Help needed climbing 3-5 steps with a railing? : A Little 6 Click Score: 23    End of Session Equipment Utilized During Treatment: Oxygen Activity Tolerance: Other (comment) (limitd by DOE) Patient left: in chair;with call bell/phone within reach Nurse Communication: Mobility status;Other (comment) (O2 tolerance) PT Visit Diagnosis: Difficulty in walking, not elsewhere classified (R26.2);Muscle weakness (generalized) (M62.81);Unsteadiness on feet (R26.81)     Time: 0454-0981 PT Time Calculation (min) (ACUTE ONLY): 33 min  Charges:  $Therapeutic Exercise: 8-22 mins $Therapeutic Activity: 8-22 mins                     Corinna Capra, PT, DPT  Acute Rehabilitation [phone] pager [phone]) [phone] office

## 2020-03-03 NOTE — Progress Notes (Signed)
PROGRESS NOTE    Andrew Allen  LGX:[id number] DOB: 1986-06-22 DOA: 02/28/2020 PCP: Julieanne Manson, MD    Brief Narrative:  Patient admitted to the hospital with working diagnosis of acute hypoxic respiratory failure due to SARS COVID-19 viral pneumonia.  34 year old male with no significant past medical history. Patient reported 8 days of generalized malaise, body aches, dizziness, dry cough, nausea, loss of appetite, sense of smell and taste. He had worsening dyspnea decided to come to the hospital for further evaluation. On his initial physical examination his oximetry on room air on ambulation was 60%. His lungs were clear to auscultation bilaterally, heart S1-S2, present rhythmic, abdomen soft, no lower extremity edema. Sodium 134, potassium 4.1, chloride 101, bicarb 23, glucose 137, BUN 9, creatinine 0.84, white count 8.1, hemoglobin 9.6, hematocrit 35.8, platelets 158. SARS COVID-19 positive. Chest radiograph had bilateral interstitial infiltrates, left upper lobe(predominant), right upper lobe, right lower lobe.  Patient has been placed on systemic steroids, remdesivir and received 1 dose of Tocilizumab.  He had very high oxygen requirements, requiring heated high flow nasal cannula, now has been transitioned to regular high flow nasal cannula with good toleration.     Assessment & Plan:   Principal Problem:   Acute respiratory failure due to COVID-19 [facility]) Active Problems:   Pneumonia due to COVID-19 virus   1.Acute hypoxic respiratory failure due to SARS COVID-19 viral pneumonia.Sp Tocilizumab 07/31. remdesivir #5/5  RR: 28 Pulse oxymetry: 93%  Fi02: 15L HFNC   COVID-19 Labs  Recent Labs    03/01/20 0420 03/02/20 0500 03/03/20 0352  DDIMER 0.59* 0.80* 1.19*  FERRITIN 621* 332 265  CRP 10.4* 5.5* 3.2*    Lab Results  Component Value Date   SARSCOV2NAA POSITIVE (A) 02/24/2020    Inflammatory markers continue to trend down, except D  dimer.  Now patient has been off heated high flow nasal cannula and tolerating well high flow nasal cannula at 15 L/min.  Continue medical therapy with systemic steroids, methylprednisolone 40 mg IV q 12 H. Today completed 5th dose of remdesivir.   Onbronchodilator therapy, antitussive agents and airway clearing technique with incentive spirometer and flutter valve. Continue to encourage prone positioning and out of bed to chair. Add scheduled combivent continue with as needed albuterol.  Continue PT/OT   Patient continue to be at high risk for worsening hypoxemic respiratory failure.   Status is: Inpatient  Remains inpatient appropriate because:IV treatments appropriate due to intensity of illness or inability to take PO   Dispo: The patient is from: Home              Anticipated d/c is to: Home              Anticipated d/c date is: > 3 days              Patient currently is not medically stable to d/c.   DVT prophylaxis: Enoxaparin   Code Status:   full  Family Communication:  No family at the bedside       Subjective: Patient reports mild improvement in dyspnea but not yet back to baseline, continue to have significant dyspnea with minimal efforts.   Objective: Vitals:   03/03/20 0200 03/03/20 0347 03/03/20 0746 03/03/20 0800  BP: 105/69 108/67  105/63  Pulse: 60 66  62  Resp: 20 19  (!) 28  Temp: 97.6 F (36.4 C) 97.8 F (36.6 C) 97.6 F (36.4 C) 97.6 F (36.4 C)  TempSrc: Axillary Axillary Oral Oral  SpO2: 96% 94%  93%  Weight:  79 kg    Height:        Intake/Output Summary (Last 24 hours) at 03/03/2020 0930 Last data filed at 03/03/2020 1093 Gross per 24 hour  Intake 360 ml  Output 1800 ml  Net -1440 ml   Filed Weights   02/28/20 0057 03/03/20 0347  Weight: 81.2 kg 79 kg    Examination:   General: deconditioned and ill looking appearing  Neurology: Awake and alert, non focal  E ENT: mild pallor, no icterus, oral mucosa moist Cardiovascular: No  JVD. S1-S2 present, rhythmic, no gallops, rubs, or murmurs. No lower extremity edema. Pulmonary: vesicular breath sounds bilaterally, no wheezing Gastrointestinal. Abdomen soft abdomen Skin. No rashes Musculoskeletal: no joint deformities     Data Reviewed: I have personally reviewed following labs and imaging studies  CBC: Recent Labs  Lab 02/28/20 0142 02/28/20 0625  WBC 8.1 10.0  NEUTROABS 6.9  --   HGB 11.6* 12.8*  HCT 35.8* 39.6  MCV 91.3 93.6  PLT 158 149*   Basic Metabolic Panel: Recent Labs  Lab 02/28/20 0142 02/28/20 0142 02/28/20 0625 02/29/20 0508 03/01/20 0420 03/02/20 0500 03/03/20 0352  NA 134*  --   --  137 137 135 131*  K 4.1  --   --  4.6 4.5 4.3 4.5  CL 101  --   --  102 101 100 94*  CO2 23  --   --  27 27 28 29   GLUCOSE 137*  --   --  187* 199* 128* 199*  BUN 9  --   --  17 16 17 13   CREATININE 0.84   < > 0.82 0.72 0.71 0.75 0.78  CALCIUM 7.8*  --   --  8.7* 8.5* 8.4* 8.6*   < > = values in this interval not displayed.   GFR: Estimated Creatinine Clearance: 116.3 mL/min (by C-G formula based on SCr of 0.78 mg/dL). Liver Function Tests: Recent Labs  Lab 02/28/20 0142 02/29/20 0508 03/01/20 0420 03/02/20 0500 03/03/20 0352  AST 31 53* 58* 44* 36  ALT 19 41 60* 89* 94*  ALKPHOS 43 52 59 60 65  BILITOT 0.8 0.6 0.5 1.0 1.0  PROT 6.5 6.9 6.5 6.5 7.1  ALBUMIN 2.7* 2.6* 2.6* 2.7* 3.0*   No results for input(s): LIPASE, AMYLASE in the last 168 hours. No results for input(s): AMMONIA in the last 168 hours. Coagulation Profile: No results for input(s): INR, PROTIME in the last 168 hours. Cardiac Enzymes: No results for input(s): CKTOTAL, CKMB, CKMBINDEX, TROPONINI in the last 168 hours. BNP (last 3 results) No results for input(s): PROBNP in the last 8760 hours. HbA1C: No results for input(s): HGBA1C in the last 72 hours. CBG: No results for input(s): GLUCAP in the last 168 hours. Lipid Profile: No results for input(s): CHOL, HDL,  LDLCALC, TRIG, CHOLHDL, LDLDIRECT in the last 72 hours. Thyroid Function Tests: No results for input(s): TSH, T4TOTAL, FREET4, T3FREE, THYROIDAB in the last 72 hours. Anemia Panel: Recent Labs    03/02/20 0500 03/03/20 0352  FERRITIN 332 265      Radiology Studies: I have reviewed all of the imaging during this hospital visit personally     Scheduled Meds: . albuterol  2 puff Inhalation Q6H  . chlorpheniramine-HYDROcodone  5 mL Oral Q12H  . enoxaparin (LOVENOX) injection  40 mg Subcutaneous Q12H  . guaiFENesin-dextromethorphan  10 mL Oral Q6H  . methylPREDNISolone (SOLU-MEDROL) injection  40 mg Intravenous BID  Continuous Infusions: . remdesivir 100 mg in NS 100 mL 100 mg (03/02/20 0902)     LOS: 4 days        Delilah Mulgrew Annett Gula, MD

## 2020-03-03 NOTE — Progress Notes (Signed)
   03/03/20 0800  Assess: MEWS Score  Temp 97.6 F (36.4 C)  BP 105/63  Pulse Rate 62  ECG Heart Rate 60  Resp (!) 28  Level of Consciousness Alert  SpO2 93 %  O2 Device HFNC  Patient Activity (if Appropriate) In bed  O2 Flow Rate (L/min) 15 L/min  Assess: MEWS Score  MEWS Temp 0  MEWS Systolic 0  MEWS Pulse 0  MEWS RR 2  MEWS LOC 0  MEWS Score 2  MEWS Score Color Yellow  Assess: if the MEWS score is Yellow or Red  Were vital signs taken at a resting state? Yes  Focused Assessment No change from prior assessment  Early Detection of Sepsis Score *See Row Information* Low  MEWS guidelines implemented *See Row Information* Yes  Treat  Pain Scale 0-10  Pain Score 0  Take Vital Signs  Increase Vital Sign Frequency  Yellow: Q 2hr X 2 then Q 4hr X 2, if remains yellow, continue Q 4hrs  Escalate  MEWS: Escalate Yellow: discuss with charge nurse/RN and consider discussing with provider and RRT  Notify: Charge Nurse/RN  Name of Charge Nurse/RN Notified Sarah RN  Date Charge Nurse/RN Notified 03/03/20  Time Charge Nurse/RN Notified [phone]  Document  Patient Outcome Other (Comment) (pt stable, continues to be tachypnic, )  Progress note created (see row info) Yes   Patient yellow MEWS 2 due to increased respiration 28, stable and able to communicate concerns,. Pt reposition in the bed and increased oxygen given at 15 L HFNC . will continue to monitor,

## 2020-03-04 LAB — C-REACTIVE PROTEIN: CRP: 1.3 mg/dL — ABNORMAL HIGH (ref <1.0)

## 2020-03-04 LAB — COMPREHENSIVE METABOLIC PANEL
ALT: 71 U/L — ABNORMAL HIGH (ref 0–44)
AST: 27 U/L (ref 15–41)
Albumin: 3 g/dL — ABNORMAL LOW (ref 3.5–5.0)
Alkaline Phosphatase: 58 U/L (ref 38–126)
Anion gap: 8 (ref 5–15)
BUN: 17 mg/dL (ref 6–20)
CO2: 28 mmol/L (ref 22–32)
Calcium: 8.6 mg/dL — ABNORMAL LOW (ref 8.9–10.3)
Chloride: 97 mmol/L — ABNORMAL LOW (ref 98–111)
Creatinine, Ser: 0.85 mg/dL (ref 0.61–1.24)
GFR calc Af Amer: >60 mL/min (ref >60)
GFR calc non Af Amer: >60 mL/min (ref >60)
Glucose, Bld: 188 mg/dL — ABNORMAL HIGH (ref 70–99)
Potassium: 5 mmol/L (ref 3.5–5.1)
Sodium: 133 mmol/L — ABNORMAL LOW (ref 135–145)
Total Bilirubin: 1 mg/dL (ref 0.3–1.2)
Total Protein: 6.8 g/dL (ref 6.5–8.1)

## 2020-03-04 LAB — CULTURE, BLOOD (ROUTINE X 2)
Culture: NO GROWTH
Culture: NO GROWTH
Special Requests: ADEQUATE

## 2020-03-04 LAB — FERRITIN: Ferritin: 177 ng/mL (ref 24–336)

## 2020-03-04 LAB — D-DIMER, QUANTITATIVE: D-Dimer, Quant: 0.98 ug/mL-FEU — ABNORMAL HIGH (ref 0.00–0.50)

## 2020-03-04 NOTE — Progress Notes (Signed)
PROGRESS NOTE    Andrew Allen  OEU:[id number] DOB: 01-Apr-1986 DOA: 02/28/2020 PCP: Julieanne Manson, MD    Brief Narrative:  Patient admitted to the hospital with working diagnosis of acute hypoxic respiratory failure due to SARS COVID-19 viral pneumonia.  34 year old male with no significant past medical history. Patient reported 8 days of generalized malaise, body aches, dizziness, dry cough, nausea, loss of appetite, sense of smell and taste. He had worsening dyspnea decided to come to the hospital for further evaluation. On his initial physical examination his oximetry on room air on ambulation was 60%. His lungs were clear to auscultation bilaterally, heart S1-S2, present rhythmic, abdomen soft, no lower extremity edema. Sodium 134, potassium 4.1, chloride 101, bicarb 23, glucose 137, BUN 9, creatinine 0.84, white count 8.1, hemoglobin 9.6, hematocrit 35.8, platelets 158. SARS COVID-19 positive. Chest radiograph had bilateral interstitial infiltrates, left upper lobe(predominant), right upper lobe, right lower lobe.  Patient has been placed on systemic steroids, remdesivir and received 1 dose of Tocilizumab.  He had very high oxygen requirements, requiring heated high flow nasal cannula, now has been transitioned to regular high flow nasal cannula with good toleration.    Assessment & Plan:   Principal Problem:   Acute respiratory failure due to COVID-19 [facility]) Active Problems:   Pneumonia due to COVID-19 virus   1.Acute hypoxic respiratory failure due to SARS COVID-19 viral pneumonia.Sp Tocilizumab 07/31. remdesivir #5/5  RR: 21  Pulse oxymetry: 93%  Fi02: 13 L HFNC   COVID-19 Labs  Recent Labs    03/02/20 0500 03/03/20 0352 03/04/20 0407  DDIMER 0.80* 1.19* 0.98*  FERRITIN 332 265 177  CRP 5.5* 3.2* 1.3*    Lab Results  Component Value Date   SARSCOV2NAA POSITIVE (A) 02/24/2020    Inflammatory markers are trending down, he continue to  have dyspnea on exertion. He has been out of bed to chair, and staying on prone position intermittently while in bed.   Tolerating well medical therapy with methylprednisolone 40 mg IV q 12 H.    Continuebronchodilator therapy, antitussive agents and airway clearing technique with incentive spirometer and flutter valve.   Continue to wean down oxygen supplementation, to target 02 saturation more than 88%.   2. Hyperkalemia. K this am is 5,0, renal function with serum cr at 0,85 and bicarbonate at 28.   Will continue close follow up on renal function and electrolytes in am.   3. Elevated liver enzymes. Likely related to COVID 19, AST down to 27 and ALT down to 71. No nausea or vomiting, no abdominal pain.   Patient continue to be at high risk for  Worsening respiratory failure.   Status is: Inpatient  Remains inpatient appropriate because:IV treatments appropriate due to intensity of illness or inability to take PO   Dispo: The patient is from: Home              Anticipated d/c is to: Home              Anticipated d/c date is: > 3 days              Patient currently is not medically stable to d/c.   DVT prophylaxis: Enoxaparin   Code Status:   full  Family Communication:  No family at the bedside        Subjective: Patient with improvement in his dyspnea but not yet back to baseline, continue to have worsening dyspnea on exertion.   Objective: Vitals:   03/03/20 2000  03/03/20 2200 03/04/20 0000 03/04/20 0400  BP: 95/66 114/75 110/75 104/63  Pulse: 70 63 60 65  Resp: (!) 28 20 (!) 25 (!) 21  Temp: 98.5 F (36.9 C) 98.2 F (36.8 C) 98.2 F (36.8 C) 98.3 F (36.8 C)  TempSrc: Oral Oral Oral Oral  SpO2: (!) 88% 93% (!) 89% 93%  Weight:      Height:        Intake/Output Summary (Last 24 hours) at 03/04/2020 0944 Last data filed at 03/04/2020 6546 Gross per 24 hour  Intake 720 ml  Output 1250 ml  Net -530 ml   Filed Weights   02/28/20 0057 03/03/20 0347    Weight: 81.2 kg 79 kg    Examination:   General: Not in pain, positive dyspnea at rest Neurology: Awake and alert, non focal  E ENT: no pallor, no icterus, oral mucosa moist Cardiovascular: No JVD. S1-S2 present, rhythmic, no gallops, rubs, or murmurs. No lower extremity edema. Pulmonary: positive breath sounds bilaterally, no wheezing Gastrointestinal. Abdomen soft and non tender Skin. No rashes Musculoskeletal: no joint deformities     Data Reviewed: I have personally reviewed following labs and imaging studies  CBC: Recent Labs  Lab 02/28/20 0142 02/28/20 0625  WBC 8.1 10.0  NEUTROABS 6.9  --   HGB 11.6* 12.8*  HCT 35.8* 39.6  MCV 91.3 93.6  PLT 158 149*   Basic Metabolic Panel: Recent Labs  Lab 02/29/20 0508 03/01/20 0420 03/02/20 0500 03/03/20 0352 03/04/20 0407  NA 137 137 135 131* 133*  K 4.6 4.5 4.3 4.5 5.0  CL 102 101 100 94* 97*  CO2 27 27 28 29 28   GLUCOSE 187* 199* 128* 199* 188*  BUN 17 16 17 13 17   CREATININE 0.72 0.71 0.75 0.78 0.85  CALCIUM 8.7* 8.5* 8.4* 8.6* 8.6*   GFR: Estimated Creatinine Clearance: 109.4 mL/min (by C-G formula based on SCr of 0.85 mg/dL). Liver Function Tests: Recent Labs  Lab 02/29/20 0508 03/01/20 0420 03/02/20 0500 03/03/20 0352 03/04/20 0407  AST 53* 58* 44* 36 27  ALT 41 60* 89* 94* 71*  ALKPHOS 52 59 60 65 58  BILITOT 0.6 0.5 1.0 1.0 1.0  PROT 6.9 6.5 6.5 7.1 6.8  ALBUMIN 2.6* 2.6* 2.7* 3.0* 3.0*   No results for input(s): LIPASE, AMYLASE in the last 168 hours. No results for input(s): AMMONIA in the last 168 hours. Coagulation Profile: No results for input(s): INR, PROTIME in the last 168 hours. Cardiac Enzymes: No results for input(s): CKTOTAL, CKMB, CKMBINDEX, TROPONINI in the last 168 hours. BNP (last 3 results) No results for input(s): PROBNP in the last 8760 hours. HbA1C: No results for input(s): HGBA1C in the last 72 hours. CBG: No results for input(s): GLUCAP in the last 168 hours. Lipid  Profile: No results for input(s): CHOL, HDL, LDLCALC, TRIG, CHOLHDL, LDLDIRECT in the last 72 hours. Thyroid Function Tests: No results for input(s): TSH, T4TOTAL, FREET4, T3FREE, THYROIDAB in the last 72 hours. Anemia Panel: Recent Labs    03/03/20 0352 03/04/20 0407  FERRITIN 265 177      Radiology Studies: I have reviewed all of the imaging during this hospital visit personally     Scheduled Meds: . chlorpheniramine-HYDROcodone  5 mL Oral Q12H  . enoxaparin (LOVENOX) injection  40 mg Subcutaneous Q12H  . guaiFENesin-dextromethorphan  10 mL Oral Q6H  . Ipratropium-Albuterol  1 puff Inhalation Q6H  . methylPREDNISolone (SOLU-MEDROL) injection  40 mg Intravenous BID   Continuous Infusions:  LOS: 5 days        Lorrie Gargan Gerome Apley, MD

## 2020-03-04 NOTE — Plan of Care (Signed)
  Problem: Safety: Goal: Ability to remain free from injury will improve Outcome: Progressing   Problem: Education: Goal: Knowledge of risk factors and measures for prevention of condition will improve Outcome: Progressing   Problem: Coping: Goal: Psychosocial and spiritual needs will be supported Outcome: Progressing   Problem: Respiratory: Goal: Will maintain a patent airway Outcome: Progressing Goal: Complications related to the disease process, condition or treatment will be avoided or minimized Outcome: Progressing

## 2020-03-05 DIAGNOSIS — E871 Hypo-osmolality and hyponatremia: Secondary | ICD-10-CM

## 2020-03-05 DIAGNOSIS — R7989 Other specified abnormal findings of blood chemistry: Secondary | ICD-10-CM

## 2020-03-05 DIAGNOSIS — E875 Hyperkalemia: Secondary | ICD-10-CM

## 2020-03-05 DIAGNOSIS — J1282 Pneumonia due to coronavirus disease 2019: Secondary | ICD-10-CM

## 2020-03-05 LAB — COMPREHENSIVE METABOLIC PANEL
ALT: 57 U/L — ABNORMAL HIGH (ref 0–44)
AST: 25 U/L (ref 15–41)
Albumin: 3 g/dL — ABNORMAL LOW (ref 3.5–5.0)
Alkaline Phosphatase: 61 U/L (ref 38–126)
Anion gap: 9 (ref 5–15)
BUN: 18 mg/dL (ref 6–20)
CO2: 26 mmol/L (ref 22–32)
Calcium: 8.5 mg/dL — ABNORMAL LOW (ref 8.9–10.3)
Chloride: 96 mmol/L — ABNORMAL LOW (ref 98–111)
Creatinine, Ser: 0.88 mg/dL (ref 0.61–1.24)
GFR calc Af Amer: >60 mL/min (ref >60)
GFR calc non Af Amer: >60 mL/min (ref >60)
Glucose, Bld: 185 mg/dL — ABNORMAL HIGH (ref 70–99)
Potassium: 4.9 mmol/L (ref 3.5–5.1)
Sodium: 131 mmol/L — ABNORMAL LOW (ref 135–145)
Total Bilirubin: 1.2 mg/dL (ref 0.3–1.2)
Total Protein: 6.5 g/dL (ref 6.5–8.1)

## 2020-03-05 LAB — C-REACTIVE PROTEIN: CRP: 0.8 mg/dL (ref <1.0)

## 2020-03-05 LAB — FERRITIN: Ferritin: 159 ng/mL (ref 24–336)

## 2020-03-05 LAB — D-DIMER, QUANTITATIVE: D-Dimer, Quant: 0.88 ug/mL-FEU — ABNORMAL HIGH (ref 0.00–0.50)

## 2020-03-05 MED ORDER — ENOXAPARIN SODIUM 40 MG/0.4ML ~~LOC~~ SOLN
40.0000 mg | SUBCUTANEOUS | Status: DC
  Administered 2020-03-06 – 2020-03-09 (×4): 40 mg via SUBCUTANEOUS
  Filled 2020-03-05 (×4): qty 0.4

## 2020-03-05 MED ORDER — GUAIFENESIN-DM 100-10 MG/5ML PO SYRP: 10.0000 mL | ORAL_SOLUTION | ORAL | Status: DC | PRN

## 2020-03-05 MED ORDER — METHYLPREDNISOLONE SODIUM SUCC 40 MG IJ SOLR
40.0000 mg | Freq: Every day | INTRAMUSCULAR | Status: DC
  Administered 2020-03-06 – 2020-03-09 (×4): 40 mg via INTRAVENOUS
  Filled 2020-03-05 (×4): qty 1

## 2020-03-05 NOTE — Progress Notes (Signed)
°   03/05/20 0400  Assess: MEWS Score  Temp 97.7 F (36.5 C)  BP (!) 96/58  Pulse Rate 64  ECG Heart Rate 64  Resp (!) 27  SpO2 95 %  Assess: MEWS Score  MEWS Temp 0  MEWS Systolic 1  MEWS Pulse 0  MEWS RR 2  MEWS LOC 0  MEWS Score 3  MEWS Score Color Yellow  Assess: if the MEWS score is Yellow or Red  Were vital signs taken at a resting state? Yes  Focused Assessment No change from prior assessment  Early Detection of Sepsis Score *See Row Information* Low  MEWS guidelines implemented *See Row Information* No, previously yellow, continue vital signs every 4 hours  Treat  Pain Scale 0-10  Pain Score 0  Take Vital Signs  Increase Vital Sign Frequency  Yellow: Q 2hr X 2 then Q 4hr X 2, if remains yellow, continue Q 4hrs  Escalate  MEWS: Escalate Yellow: discuss with charge nurse/RN and consider discussing with provider and RRT  Notify: Charge Nurse/RN  Name of Charge Nurse/RN Notified Casimiro Needle OI,  RN  Date Charge Nurse/RN Notified 03/05/20  Time Charge Nurse/RN Notified [phone]

## 2020-03-05 NOTE — Progress Notes (Signed)
Occupational Therapy Treatment Patient Details Name: Andrew Allen MRN: [id number] DOB: 07-23-1986 Today's Date: 03/05/2020    History of present illness 34 y/o presenting with +COVID-19 with fevers, headaches, generalized whole body aches, dizziness, dry persistent coughing, nausea with posttussive emesis, loss of appetite, no taste and shortness of breath. No significant PMH on file   OT comments  Patient up in chair on arrival.  He was on 15L O2 via HFNC throughout session, with SpO2 89 at rest. Patient slightly more unsteady this session than previous visits.  Had one minor loss of balance but able to self correct.  Required min guard with stand and walk.  Worked on increasing activity tolerance and practicing energy conservation, standing 2x for 3 min each.  Desat to 80 with each stand.  Will continue to follow with OT acutely to address the deficits listed below.    Follow Up Recommendations  No OT follow up    Equipment Recommendations  Tub/shower seat    Recommendations for Other Services      Precautions / Restrictions Precautions Precautions: Other (comment) Precaution Comments: Watch O2 sats on lots of O2 Restrictions Weight Bearing Restrictions: No       Mobility Bed Mobility               General bed mobility comments: Pt was OOB in the recliner chair.   Transfers Overall transfer level: Needs assistance Equipment used: None Transfers: Sit to/from Stand Sit to Stand: Min guard         General transfer comment: Patient more unsteady this session than previous visits    Balance Overall balance assessment: Needs assistance Sitting-balance support: Feet supported;No upper extremity supported Sitting balance-Leahy Scale: Good     Standing balance support: No upper extremity supported Standing balance-Leahy Scale: Fair Standing balance comment: Presenting a little more unsteady this session, though he was feeling dizzy and thought that may be  the reason.  Had one minor LOB though able to self correct                           ADL either performed or assessed with clinical judgement   ADL Overall ADL's : Needs assistance/impaired                                     Functional mobility during ADLs: Min guard General ADL Comments: Session focused on increasing activity tolerance and energy conservation.     Vision       Perception     Praxis      Cognition Arousal/Alertness: Awake/alert Behavior During Therapy: WFL for tasks assessed/performed Overall Cognitive Status: Within Functional Limits for tasks assessed                                          Exercises Exercises: Other exercises Other Exercises Other Exercises: 2x 3 min stand and walk 62ft   Shoulder Instructions       General Comments BP 104/63, no noted decrease in standing    Pertinent Vitals/ Pain       Pain Assessment: No/denies pain  Home Living  Prior Functioning/Environment              Frequency  Min 3X/week        Progress Toward Goals  OT Goals(current goals can now be found in the care plan section)  Progress towards OT goals: Progressing toward goals  Acute Rehab OT Goals Patient Stated Goal: return to independence OT Goal Formulation: With patient Time For Goal Achievement: 03/14/20 Potential to Achieve Goals: Good  Plan Discharge plan remains appropriate    Co-evaluation                 AM-PAC OT "6 Clicks" Daily Activity     Outcome Measure   Help from another person eating meals?: None Help from another person taking care of personal grooming?: A Little Help from another person toileting, which includes using toliet, bedpan, or urinal?: A Little Help from another person bathing (including washing, rinsing, drying)?: A Little Help from another person to put on and taking off regular upper body  clothing?: None Help from another person to put on and taking off regular lower body clothing?: A Little 6 Click Score: 20    End of Session Equipment Utilized During Treatment: Oxygen  OT Visit Diagnosis: Other abnormalities of gait and mobility (R26.89);Other (comment)   Activity Tolerance Patient tolerated treatment well   Patient Left in chair;with call bell/phone within reach   Nurse Communication Mobility status        Time: 2458-0998 OT Time Calculation (min): 17 min  Charges: OT General Charges $OT Visit: 1 Visit OT Treatments $Therapeutic Activity: 8-22 mins  Barbie Banner, OTR/L    Adella Hare 03/05/2020, 11:39 AM

## 2020-03-05 NOTE — Progress Notes (Signed)
PROGRESS NOTE    Andrew Allen  BBC:[id number] DOB: 1986-05-27 DOA: 02/28/2020 PCP: Julieanne Manson, MD    Brief Narrative:  Patient admitted to the hospital with working diagnosis of acute hypoxic respiratory failure due to SARS COVID-19 viral pneumonia.  34 year old male with no significant past medical history. Patient reported 8 days of generalized malaise, body aches, dizziness, dry cough, nausea, loss of appetite, sense of smell and taste. He had worsening dyspnea decided to come to the hospital for further evaluation. On his initial physical examination his oximetry on room air on ambulation was 60%. His lungs were clear to auscultation bilaterally, heart S1-S2, present rhythmic, abdomen soft, no lower extremity edema. Sodium 134, potassium 4.1, chloride 101, bicarb 23, glucose 137, BUN 9, creatinine 0.84, white count 8.1, hemoglobin 9.6, hematocrit 35.8, platelets 158. SARS COVID-19 positive. Chest radiograph had bilateral interstitial infiltrates, left upper lobe(predominant), right upper lobe, right lower lobe.  Patient has been placed on systemic steroids, remdesivir and received 1 dose of Tocilizumab.  Hehadvery high oxygen requirements, requiring heated high flow nasal cannula, now has been transitioned to regular high flow nasal cannula with good toleration.   Assessment & Plan:   Principal Problem:   Acute respiratory failure due to COVID-19 [facility]) Active Problems:   Pneumonia due to COVID-19 virus   1.Acute hypoxic respiratory failure due to SARS COVID-19 viral pneumonia.Sp Tocilizumab 07/31. remdesivir #5/5  RR: 25  Pulse oxymetry: 98%  Fi02: 9 L HFNC  COVID-19 Labs  Recent Labs    03/03/20 0352 03/04/20 0407 03/05/20 0342  DDIMER 1.19* 0.98* 0.88*  FERRITIN 265 177 159  CRP 3.2* 1.3* 0.8    Lab Results  Component Value Date   SARSCOV2NAA POSITIVE (A) 02/24/2020     Inflammatory markers continue to trend down, decreased  oxygen requirements this am down to 9 L/ min  Will decrease dose of methylprednisolone from 40 mg IV q12 to q 24H.   Onbronchodilator therapy, antitussive agents and airway clearing technique with incentive spirometer and flutter valve.He has been getting out of bed to chair and staying prone at night while in bed.    2. Hyperkalemia and hyponatremia. Today K is down to 4,9, Na 131 with renal function stable, serum cr 0,88 and bicarbonate at 26.   Patient tolerating po well, will continue to hold on IV fluids for now, to follow a restrictive IV fluids strategy. Follow on renal function and electrolytes in am.   3. Elevated liver enzymes. Likely related to COVID 19, AST and ALT continue to improve. No abdominal pain, no nausea or vomiting   Status is: Inpatient  Remains inpatient appropriate because:IV treatments appropriate due to intensity of illness or inability to take PO   Dispo: The patient is from: Home              Anticipated d/c is to: Home              Anticipated d/c date is: 3 days              Patient currently is not medically stable to d/c.   DVT prophylaxis: Enoxaparin   Code Status:   full  Family Communication:  Patient will update his family       Subjective: Patient is feeling better, but continue to have dyspnea on exertion and persistent cough. He has been out of bed and working with PT/OT. Prone at night while in bed and using incentive spirometer and flutter valve.   Objective:  Vitals:   03/05/20 0512 03/05/20 0600 03/05/20 0746 03/05/20 0800  BP: 97/61 95/63 98/60  100/62  Pulse: 75 70 68 64  Resp: (!) 24 (!) 21 (!) 24 (!) 25  Temp: 97.7 F (36.5 C)  97.9 F (36.6 C)   TempSrc: Oral  Oral   SpO2: 91% 98% 94% 98%  Weight:      Height:        Intake/Output Summary (Last 24 hours) at 03/05/2020 0925 Last data filed at 03/05/2020 0600 Gross per 24 hour  Intake 480 ml  Output 1350 ml  Net -870 ml   Filed Weights   02/28/20 0057 03/03/20  0347  Weight: 81.2 kg 79 kg    Examination:   General: deconditioned  Neurology: Awake and alert, non focal  E ENT: no pallor, no icterus, oral mucosa moist Cardiovascular: No JVD. S1-S2 present, rhythmic, no gallops, rubs, or murmurs. No lower extremity edema. Pulmonary: positive breath sounds bilaterally, no wheezing Gastrointestinal. Abdomen flat, soft and non tender Musculoskeletal: no joint deformities     Data Reviewed: I have personally reviewed following labs and imaging studies  CBC: Recent Labs  Lab 02/28/20 0142 02/28/20 0625  WBC 8.1 10.0  NEUTROABS 6.9  --   HGB 11.6* 12.8*  HCT 35.8* 39.6  MCV 91.3 93.6  PLT 158 149*   Basic Metabolic Panel: Recent Labs  Lab 03/01/20 0420 03/02/20 0500 03/03/20 0352 03/04/20 0407 03/05/20 0342  NA 137 135 131* 133* 131*  K 4.5 4.3 4.5 5.0 4.9  CL 101 100 94* 97* 96*  CO2 27 28 29 28 26   GLUCOSE 199* 128* 199* 188* 185*  BUN 16 17 13 17 18   CREATININE 0.71 0.75 0.78 0.85 0.88  CALCIUM 8.5* 8.4* 8.6* 8.6* 8.5*   GFR: Estimated Creatinine Clearance: 105.7 mL/min (by C-G formula based on SCr of 0.88 mg/dL). Liver Function Tests: Recent Labs  Lab 03/01/20 0420 03/02/20 0500 03/03/20 0352 03/04/20 0407 03/05/20 0342  AST 58* 44* 36 27 25  ALT 60* 89* 94* 71* 57*  ALKPHOS 59 60 65 58 61  BILITOT 0.5 1.0 1.0 1.0 1.2  PROT 6.5 6.5 7.1 6.8 6.5  ALBUMIN 2.6* 2.7* 3.0* 3.0* 3.0*   No results for input(s): LIPASE, AMYLASE in the last 168 hours. No results for input(s): AMMONIA in the last 168 hours. Coagulation Profile: No results for input(s): INR, PROTIME in the last 168 hours. Cardiac Enzymes: No results for input(s): CKTOTAL, CKMB, CKMBINDEX, TROPONINI in the last 168 hours. BNP (last 3 results) No results for input(s): PROBNP in the last 8760 hours. HbA1C: No results for input(s): HGBA1C in the last 72 hours. CBG: No results for input(s): GLUCAP in the last 168 hours. Lipid Profile: No results for  input(s): CHOL, HDL, LDLCALC, TRIG, CHOLHDL, LDLDIRECT in the last 72 hours. Thyroid Function Tests: No results for input(s): TSH, T4TOTAL, FREET4, T3FREE, THYROIDAB in the last 72 hours. Anemia Panel: Recent Labs    03/04/20 0407 03/05/20 0342  FERRITIN 177 159      Radiology Studies: I have reviewed all of the imaging during this hospital visit personally     Scheduled Meds: . chlorpheniramine-HYDROcodone  5 mL Oral Q12H  . enoxaparin (LOVENOX) injection  40 mg Subcutaneous Q12H  . guaiFENesin-dextromethorphan  10 mL Oral Q6H  . Ipratropium-Albuterol  1 puff Inhalation Q6H  . methylPREDNISolone (SOLU-MEDROL) injection  40 mg Intravenous BID   Continuous Infusions:   LOS: 6 days  Jaskirat Schwieger Gerome Apley, MD

## 2020-03-06 LAB — COMPREHENSIVE METABOLIC PANEL
ALT: 42 U/L (ref 0–44)
AST: 18 U/L (ref 15–41)
Albumin: 2.7 g/dL — ABNORMAL LOW (ref 3.5–5.0)
Alkaline Phosphatase: 55 U/L (ref 38–126)
Anion gap: 9 (ref 5–15)
BUN: 17 mg/dL (ref 6–20)
CO2: 29 mmol/L (ref 22–32)
Calcium: 8.4 mg/dL — ABNORMAL LOW (ref 8.9–10.3)
Chloride: 97 mmol/L — ABNORMAL LOW (ref 98–111)
Creatinine, Ser: 0.83 mg/dL (ref 0.61–1.24)
GFR calc Af Amer: >60 mL/min (ref >60)
GFR calc non Af Amer: >60 mL/min (ref >60)
Glucose, Bld: 120 mg/dL — ABNORMAL HIGH (ref 70–99)
Potassium: 3.9 mmol/L (ref 3.5–5.1)
Sodium: 135 mmol/L (ref 135–145)
Total Bilirubin: 0.9 mg/dL (ref 0.3–1.2)
Total Protein: 6 g/dL — ABNORMAL LOW (ref 6.5–8.1)

## 2020-03-06 LAB — FERRITIN: Ferritin: 137 ng/mL (ref 24–336)

## 2020-03-06 LAB — D-DIMER, QUANTITATIVE: D-Dimer, Quant: 1.42 ug/mL-FEU — ABNORMAL HIGH (ref 0.00–0.50)

## 2020-03-06 LAB — C-REACTIVE PROTEIN: CRP: 0.5 mg/dL (ref <1.0)

## 2020-03-06 NOTE — Progress Notes (Signed)
PROGRESS NOTE                                                                                                                                                                                                             Patient Demographics:    Andrew Allen, is a 34 y.o. male, DOB - 1986-05-27, QVZ:[id number]  Outpatient Primary MD for the patient is Julieanne Manson, MD   Admit date - 02/28/2020   LOS - 7  Chief Complaint  Patient presents with  . Shortness of Breath       Brief Narrative: Patient is a 34 y.o. male with no PMHx presented to the hospital with approximately 1 week history of fever, cough, myalgias-he was diagnosed with COVID-19 on 7/27 (unvaccinated against COVID-19)-upon further evaluation-he was found to have severe hypoxemia requiring 15 L HFNC secondary to COVID-19 pneumonia.  Significant Events: 7/27>> COVID-19 positive 7/30>> admit to [facility] for severe hypoxemia requiring 15 L HF [place] secondary to COVID-19 pneumonia  Significant studies: 7/31>> chest x-ray: Multifocal pneumonia  COVID-19 medications: Steroids: 7/31>> Remdesivir: 7/31>> 8/4 Actemra: 7/31 x 1  Antibiotics: None  Microbiology data: 7/31>> blood culture: Pending  Procedures: None  Consults: None  DVT prophylaxis: enoxaparin (LOVENOX) injection 40 mg Start: 02/28/20    Subjective:    Madaline Guthrie Chipol Ambros today is lying comfortably in bed-on 15 L of high flow oxygen.   Assessment  & Plan :   Acute Hypoxic Resp Failure due to Covid 19 Viral pneumonia: Has severe parenchymal disease-stable requiring HFNC-although slowly with decreasing O2 requirements.  Continue supportive care-continue tapering steroids-continue attempts to slowly titrate down FiO2.  Fever: afebrile O2 requirements:  SpO2: 93 % O2 Flow Rate (L/min): 9 L/min FiO2 (%): 80 %   COVID-19 Labs: Recent Labs    03/04/20 0407 03/05/20 0342  03/06/20 0455  DDIMER 0.98* 0.88* 1.42*  FERRITIN 177 159 137  CRP 1.3* 0.8 0.5    No results found for: BNP  No results for input(s): PROCALCITON in the last 168 hours.  Lab Results  Component Value Date   SARSCOV2NAA POSITIVE (A) 02/24/2020    Prone/Incentive Spirometry: encouraged patient to lie prone for 3-4 hours at a time for a total of 16 hours a day, and to encourage incentive spirometry use 3-4/hour.   Obesity: Estimated body mass index is 31.85  kg/m as calculated from the following:   Height as of this encounter: 5\' 2"  (1.575 m).   Weight as of this encounter: 79 kg.    GI prophylaxis: PPI  ABG: No results found for: PHART, PCO2ART, PO2ART, HCO3, TCO2, ACIDBASEDEF, O2SAT  Vent Settings: N/A  Condition -Guarded  Family Communication  : None at bedside-we will update family over the next few days.  Code Status :  Full Code  Diet :  Diet Order            Diet regular Room service appropriate? Yes; Fluid consistency: Thin  Diet effective now                  Disposition Plan  :  Status is: Inpatient  Remains inpatient appropriate because:Inpatient level of care appropriate due to severity of illness   Dispo: The patient is from: Home              Anticipated d/c is to: Home              Anticipated d/c date is: > 3 days              Patient currently is not medically stable to d/c.   Barriers to discharge: Hypoxia requiring O2 supplementation/complete 5 days of IV Remdesivir  Antimicorbials  :    Anti-infectives (From admission, onward)   Start     Dose/Rate Route Frequency Ordered Stop   02/29/20 1000  remdesivir 100 mg in sodium chloride 0.9 % 100 mL IVPB       "Followed by" Linked Group Details   100 mg 200 mL/hr over 30 Minutes Intravenous Daily 02/28/20 0514 03/03/20 1118   02/28/20 0630  remdesivir 200 mg in sodium chloride 0.9% 250 mL IVPB       "Followed by" Linked Group Details   200 mg 580 mL/hr over 30 Minutes Intravenous Once  02/28/20 0514 02/28/20 0941      Inpatient Medications  Scheduled Meds: . chlorpheniramine-HYDROcodone  5 mL Oral Q12H  . enoxaparin (LOVENOX) injection  40 mg Subcutaneous Q24H  . Ipratropium-Albuterol  1 puff Inhalation Q6H  . methylPREDNISolone (SOLU-MEDROL) injection  40 mg Intravenous Daily   Continuous Infusions:  PRN Meds:.acetaminophen **OR** acetaminophen, albuterol, guaiFENesin-dextromethorphan, ondansetron **OR** ondansetron (ZOFRAN) IV, phenol, polyethylene glycol   Time Spent in minutes  25   See all Orders from today for further details   03/01/20 M.D on 03/06/2020 at 1:57 PM  To page go to [email or web address] - use universal password  [facility] -  Office  [phone]    Objective:   Vitals:   03/06/20 0200 03/06/20 0403 03/06/20 0627 03/06/20 0800  BP: 98/70 (!) 103/59  104/61  Pulse: 63 72  67  Resp: (!) 26 (!) 27  (!) 30  Temp: 98.1 F (36.7 C) 97.9 F (36.6 C) 98.2 F (36.8 C) 98 F (36.7 C)  TempSrc: Oral Oral Oral Oral  SpO2: 95%   93%  Weight:      Height:        Wt Readings from Last 3 Encounters:  03/03/20 79 kg  05/19/15 81.2 kg     Intake/Output Summary (Last 24 hours) at 03/06/2020 1357 Last data filed at 03/06/2020 05/06/2020 Gross per 24 hour  Intake 240 ml  Output 750 ml  Net -510 ml     Physical Exam Gen Exam:Alert awake-not in any distress HEENT:atraumatic, normocephalic Chest: B/L clear to auscultation anteriorly CVS:S1S2 regular Abdomen:soft non tender, non  distended Extremities:no edema Neurology: Non focal Skin: no rash   Data Review:    CBC No results for input(s): WBC, HGB, HCT, PLT, MCV, MCH, MCHC, RDW, LYMPHSABS, MONOABS, EOSABS, BASOSABS, BANDABS in the last 168 hours.  Invalid input(s): NEUTRABS, BANDSABD  Chemistries  Recent Labs  Lab 03/02/20 0500 03/03/20 0352 03/04/20 0407 03/05/20 0342 03/06/20 0455  NA 135 131* 133* 131* 135  K 4.3 4.5 5.0 4.9 3.9  CL 100 94* 97* 96* 97*  CO2 28 29  28 26 29   GLUCOSE 128* 199* 188* 185* 120*  BUN 17 13 17 18 17   CREATININE 0.75 0.78 0.85 0.88 0.83  CALCIUM 8.4* 8.6* 8.6* 8.5* 8.4*  AST 44* 36 27 25 18   ALT 89* 94* 71* 57* 42  ALKPHOS 60 65 58 61 55  BILITOT 1.0 1.0 1.0 1.2 0.9   ------------------------------------------------------------------------------------------------------------------ No results for input(s): CHOL, HDL, LDLCALC, TRIG, CHOLHDL, LDLDIRECT in the last 72 hours.  No results found for: HGBA1C ------------------------------------------------------------------------------------------------------------------ No results for input(s): TSH, T4TOTAL, T3FREE, THYROIDAB in the last 72 hours.  Invalid input(s): FREET3 ------------------------------------------------------------------------------------------------------------------ Recent Labs    03/05/20 0342 03/06/20 0455  FERRITIN 159 137    Coagulation profile No results for input(s): INR, PROTIME in the last 168 hours.  Recent Labs    03/05/20 0342 03/06/20 0455  DDIMER 0.88* 1.42*    Cardiac Enzymes No results for input(s): CKMB, TROPONINI, MYOGLOBIN in the last 168 hours.  Invalid input(s): CK ------------------------------------------------------------------------------------------------------------------ No results found for: BNP  Micro Results Recent Results (from the past 240 hour(s))  Blood Culture (routine x 2)     Status: None   Collection Time: 02/28/20  1:45 AM   Specimen: BLOOD  Result Value Ref Range Status   Specimen Description BLOOD RIGHT HAND  Final   Special Requests   Final    BOTTLES DRAWN AEROBIC AND ANAEROBIC Blood Culture adequate volume   Culture   Final    NO GROWTH 5 DAYS Performed at [facility], [address], [place], [facility] [address]    Report Status 03/04/2020 FINAL  Final  Blood Culture (routine x 2)     Status: None   Collection Time: 02/28/20  1:45 AM   Specimen: BLOOD  Result Value Ref Range  Status   Specimen Description BLOOD RIGHT ARM  Final   Special Requests   Final    BOTTLES DRAWN AEROBIC AND ANAEROBIC Blood Culture results may not be optimal due to an excessive volume of blood received in culture bottles   Culture   Final    NO GROWTH 5 DAYS Performed at [facility], [address], [place], [facility] [address]    Report Status 03/04/2020 FINAL  Final    Radiology Reports DG Chest Port 1 View  Result Date: 02/28/2020 CLINICAL DATA:  COVID positive he EXAM: PORTABLE CHEST 1 VIEW COMPARISON:  None. FINDINGS: The heart size and mediastinal contours are within normal limits. Multifocal patchy airspace opacities are seen throughout both lungs, predominantly within the periphery of the left lung. No pleural effusion. No acute osseous abnormality. IMPRESSION: Multifocal airspace opacities, left greater than right, consistent with multifocal pneumonia Electronically Signed   By: [id number] M.D.   On: 02/28/2020 01:26

## 2020-03-07 LAB — COMPREHENSIVE METABOLIC PANEL
ALT: 37 U/L (ref 0–44)
AST: 19 U/L (ref 15–41)
Albumin: 2.9 g/dL — ABNORMAL LOW (ref 3.5–5.0)
Alkaline Phosphatase: 58 U/L (ref 38–126)
Anion gap: 9 (ref 5–15)
BUN: 16 mg/dL (ref 6–20)
CO2: 27 mmol/L (ref 22–32)
Calcium: 8.5 mg/dL — ABNORMAL LOW (ref 8.9–10.3)
Chloride: 97 mmol/L — ABNORMAL LOW (ref 98–111)
Creatinine, Ser: 0.68 mg/dL (ref 0.61–1.24)
GFR calc Af Amer: >60 mL/min (ref >60)
GFR calc non Af Amer: >60 mL/min (ref >60)
Glucose, Bld: 115 mg/dL — ABNORMAL HIGH (ref 70–99)
Potassium: 4 mmol/L (ref 3.5–5.1)
Sodium: 133 mmol/L — ABNORMAL LOW (ref 135–145)
Total Bilirubin: 1 mg/dL (ref 0.3–1.2)
Total Protein: 5.9 g/dL — ABNORMAL LOW (ref 6.5–8.1)

## 2020-03-07 LAB — CBC
HCT: 43.7 % (ref 39.0–52.0)
Hemoglobin: 14.4 g/dL (ref 13.0–17.0)
MCH: 29.2 pg (ref 26.0–34.0)
MCHC: 33 g/dL (ref 30.0–36.0)
MCV: 88.6 fL (ref 80.0–100.0)
Platelets: 300 10*3/uL (ref 150–400)
RBC: 4.93 MIL/uL (ref 4.22–5.81)
RDW: 13.7 % (ref 11.5–15.5)
WBC: 16.3 10*3/uL — ABNORMAL HIGH (ref 4.0–10.5)
nRBC: 0 % (ref 0.0–0.2)

## 2020-03-07 LAB — FERRITIN: Ferritin: 140 ng/mL (ref 24–336)

## 2020-03-07 LAB — C-REACTIVE PROTEIN: CRP: 0.6 mg/dL (ref <1.0)

## 2020-03-07 LAB — D-DIMER, QUANTITATIVE: D-Dimer, Quant: 2.1 ug/mL-FEU — ABNORMAL HIGH (ref 0.00–0.50)

## 2020-03-07 NOTE — Progress Notes (Signed)
PROGRESS NOTE                                                                                                                                                                                                             Patient Demographics:    Andrew Allen, is a 34 y.o. male, DOB - 10-19-1985, FVC:[id number]  Outpatient Primary MD for the patient is Julieanne Manson, MD   Admit date - 02/28/2020   LOS - 8  Chief Complaint  Patient presents with  . Shortness of Breath       Brief Narrative: Patient is a 34 y.o. male with no PMHx presented to the hospital with approximately 1 week history of fever, cough, myalgias-he was diagnosed with COVID-19 on 7/27 (unvaccinated against COVID-19)-upon further evaluation-he was found to have severe hypoxemia requiring 15 L HFNC secondary to COVID-19 pneumonia.  Significant Events: 7/27>> COVID-19 positive 7/30>> admit to [facility], [place] for severe hypoxemia requiring 15 L HF [place] secondary to COVID-19 pneumonia  Significant studies: 7/31>> chest x-ray: Multifocal pneumonia  COVID-19 medications: Steroids: 7/31>> Remdesivir: 7/31>> 8/4 Actemra: 7/31 x 1  Antibiotics: None  Microbiology data: 7/31>> blood culture: neg  Procedures: None  Consults: None  DVT prophylaxis: enoxaparin (LOVENOX) injection 40 mg Start: 02/28/20    Subjective:    Andrew Allen today is lying comfortably in bed-he feels better today-down to 9-10 L of HFNC.   Assessment  & Plan :   Acute Hypoxic Resp Failure due to Covid 19 Viral pneumonia: Had severe hypoxemia that seems to be slowly improving-down to 10 L of HFNC.  Continue supportive care-continue steroids for another day or so-continue attempts to slowly titrate down FiO2.  Volume status remains stable.   Fever: afebrile O2 requirements:  SpO2: 94 % O2 Flow Rate (L/min): 10 L/min FiO2 (%): 80 %   COVID-19 Labs: Recent Labs     03/05/20 0342 03/06/20 0455 03/07/20 0455  DDIMER 0.88* 1.42* 2.10*  FERRITIN 159 137 140  CRP 0.8 0.5 0.6    No results found for: BNP  No results for input(s): PROCALCITON in the last 168 hours.  Lab Results  Component Value Date   SARSCOV2NAA POSITIVE (A) 02/24/2020    Prone/Incentive Spirometry: encouraged patient to lie prone for 3-4 hours at a time for a total of 16 hours a day, and  to encourage incentive spirometry use 3-4/hour.   Leukocytosis: Likely secondary to steroids-no indication of secondary bacterial infection.  Follow periodically.  Obesity: Estimated body mass index is 31.85 kg/m as calculated from the following:   Height as of this encounter: 5\' 2"  (1.575 m).   Weight as of this encounter: 79 kg.    GI prophylaxis: PPI  ABG: No results found for: PHART, PCO2ART, PO2ART, HCO3, TCO2, ACIDBASEDEF, O2SAT  Vent Settings: N/A  Condition -Guarded  Family Communication  : None at bedside-we will update family over the next few days.  Code Status :  Full Code  Diet :  Diet Order            Diet regular Room service appropriate? Yes; Fluid consistency: Thin  Diet effective now                  Disposition Plan  :  Status is: Inpatient  Remains inpatient appropriate because:Inpatient level of care appropriate due to severity of illness   Dispo: The patient is from: Home              Anticipated d/c is to: Home              Anticipated d/c date is: > 3 days              Patient currently is not medically stable to d/c.   Barriers to discharge: Severe hypoxia requiring O2 supplementation  Antimicorbials  :    Anti-infectives (From admission, onward)   Start     Dose/Rate Route Frequency Ordered Stop   02/29/20 1000  remdesivir 100 mg in sodium chloride 0.9 % 100 mL IVPB       "Followed by" Linked Group Details   100 mg 200 mL/hr over 30 Minutes Intravenous Daily 02/28/20 0514 03/03/20 1118   02/28/20 0630  remdesivir 200 mg in sodium  chloride 0.9% 250 mL IVPB       "Followed by" Linked Group Details   200 mg 580 mL/hr over 30 Minutes Intravenous Once 02/28/20 0514 02/28/20 0941      Inpatient Medications  Scheduled Meds: . chlorpheniramine-HYDROcodone  5 mL Oral Q12H  . enoxaparin (LOVENOX) injection  40 mg Subcutaneous Q24H  . Ipratropium-Albuterol  1 puff Inhalation Q6H  . methylPREDNISolone (SOLU-MEDROL) injection  40 mg Intravenous Daily   Continuous Infusions:  PRN Meds:.acetaminophen **OR** acetaminophen, albuterol, guaiFENesin-dextromethorphan, ondansetron **OR** ondansetron (ZOFRAN) IV, phenol, polyethylene glycol   Time Spent in minutes  25   See all Orders from today for further details   03/01/20 M.D on 03/07/2020 at 10:59 AM  To page go to [email or web address] - use universal password  [facility] -  Office  [phone]    Objective:   Vitals:   03/07/20 0000 03/07/20 0400 03/07/20 0403 03/07/20 0754  BP: 102/66 100/64 98/64 104/63  Pulse: 64 (!) 59  69  Resp: (!) 21 (!) 22 20 (!) 23  Temp: 98.3 F (36.8 C) 97.9 F (36.6 C) 97.9 F (36.6 C) 98.8 F (37.1 C)  TempSrc: Oral Oral Oral Oral  SpO2: 99% 96%  94%  Weight:      Height:        Wt Readings from Last 3 Encounters:  03/03/20 79 kg  05/19/15 81.2 kg     Intake/Output Summary (Last 24 hours) at 03/07/2020 1059 Last data filed at 03/07/2020 0907 Gross per 24 hour  Intake 315 ml  Output 2200 ml  Net -1885 ml  Physical Exam Gen Exam:Alert awake-not in any distress HEENT:atraumatic, normocephalic Chest: B/L clear to auscultation anteriorly CVS:S1S2 regular Abdomen:soft non tender, non distended Extremities:no edema Neurology: Non focal Skin: no rash   Data Review:    CBC Recent Labs  Lab 03/07/20 0455  WBC 16.3*  HGB 14.4  HCT 43.7  PLT 300  MCV 88.6  MCH 29.2  MCHC 33.0  RDW 13.7    Chemistries  Recent Labs  Lab 03/03/20 0352 03/04/20 0407 03/05/20 0342 03/06/20 0455 03/07/20 0455    NA 131* 133* 131* 135 133*  K 4.5 5.0 4.9 3.9 4.0  CL 94* 97* 96* 97* 97*  CO2 29 28 26 29 27   GLUCOSE 199* 188* 185* 120* 115*  BUN 13 17 18 17 16   CREATININE 0.78 0.85 0.88 0.83 0.68  CALCIUM 8.6* 8.6* 8.5* 8.4* 8.5*  AST 36 27 25 18 19   ALT 94* 71* 57* 42 37  ALKPHOS 65 58 61 55 58  BILITOT 1.0 1.0 1.2 0.9 1.0   ------------------------------------------------------------------------------------------------------------------ No results for input(s): CHOL, HDL, LDLCALC, TRIG, CHOLHDL, LDLDIRECT in the last 72 hours.  No results found for: HGBA1C ------------------------------------------------------------------------------------------------------------------ No results for input(s): TSH, T4TOTAL, T3FREE, THYROIDAB in the last 72 hours.  Invalid input(s): FREET3 ------------------------------------------------------------------------------------------------------------------ Recent Labs    03/06/20 0455 03/07/20 0455  FERRITIN 137 140    Coagulation profile No results for input(s): INR, PROTIME in the last 168 hours.  Recent Labs    03/06/20 0455 03/07/20 0455  DDIMER 1.42* 2.10*    Cardiac Enzymes No results for input(s): CKMB, TROPONINI, MYOGLOBIN in the last 168 hours.  Invalid input(s): CK ------------------------------------------------------------------------------------------------------------------ No results found for: BNP  Micro Results Recent Results (from the past 240 hour(s))  Blood Culture (routine x 2)     Status: None   Collection Time: 02/28/20  1:45 AM   Specimen: BLOOD  Result Value Ref Range Status   Specimen Description BLOOD RIGHT HAND  Final   Special Requests   Final    BOTTLES DRAWN AEROBIC AND ANAEROBIC Blood Culture adequate volume   Culture   Final    NO GROWTH 5 DAYS Performed at [facility], [address], [place], [facility] [address]    Report Status 03/04/2020 FINAL  Final  Blood Culture (routine x 2)     Status:  None   Collection Time: 02/28/20  1:45 AM   Specimen: BLOOD  Result Value Ref Range Status   Specimen Description BLOOD RIGHT ARM  Final   Special Requests   Final    BOTTLES DRAWN AEROBIC AND ANAEROBIC Blood Culture results may not be optimal due to an excessive volume of blood received in culture bottles   Culture   Final    NO GROWTH 5 DAYS Performed at [facility], [address], [place], [facility] [address]    Report Status 03/04/2020 FINAL  Final    Radiology Reports DG Chest Port 1 View  Result Date: 02/28/2020 CLINICAL DATA:  COVID positive he EXAM: PORTABLE CHEST 1 VIEW COMPARISON:  None. FINDINGS: The heart size and mediastinal contours are within normal limits. Multifocal patchy airspace opacities are seen throughout both lungs, predominantly within the periphery of the left lung. No pleural effusion. No acute osseous abnormality. IMPRESSION: Multifocal airspace opacities, left greater than right, consistent with multifocal pneumonia Electronically Signed   By: [id number] M.D.   On: 02/28/2020 01:26

## 2020-03-08 LAB — COMPREHENSIVE METABOLIC PANEL
ALT: 36 U/L (ref 0–44)
AST: 18 U/L (ref 15–41)
Albumin: 2.8 g/dL — ABNORMAL LOW (ref 3.5–5.0)
Alkaline Phosphatase: 74 U/L (ref 38–126)
Anion gap: 9 (ref 5–15)
BUN: 17 mg/dL (ref 6–20)
CO2: 27 mmol/L (ref 22–32)
Calcium: 8.4 mg/dL — ABNORMAL LOW (ref 8.9–10.3)
Chloride: 98 mmol/L (ref 98–111)
Creatinine, Ser: 0.86 mg/dL (ref 0.61–1.24)
GFR calc Af Amer: >60 mL/min (ref >60)
GFR calc non Af Amer: >60 mL/min (ref >60)
Glucose, Bld: 189 mg/dL — ABNORMAL HIGH (ref 70–99)
Potassium: 4 mmol/L (ref 3.5–5.1)
Sodium: 134 mmol/L — ABNORMAL LOW (ref 135–145)
Total Bilirubin: 0.6 mg/dL (ref 0.3–1.2)
Total Protein: 5.8 g/dL — ABNORMAL LOW (ref 6.5–8.1)

## 2020-03-08 LAB — CBC
HCT: 43.2 % (ref 39.0–52.0)
Hemoglobin: 14.6 g/dL (ref 13.0–17.0)
MCH: 30.2 pg (ref 26.0–34.0)
MCHC: 33.8 g/dL (ref 30.0–36.0)
MCV: 89.4 fL (ref 80.0–100.0)
Platelets: 275 10*3/uL (ref 150–400)
RBC: 4.83 MIL/uL (ref 4.22–5.81)
RDW: 13.4 % (ref 11.5–15.5)
WBC: 16.3 10*3/uL — ABNORMAL HIGH (ref 4.0–10.5)
nRBC: 0 % (ref 0.0–0.2)

## 2020-03-08 LAB — C-REACTIVE PROTEIN: CRP: 0.9 mg/dL (ref <1.0)

## 2020-03-08 LAB — D-DIMER, QUANTITATIVE: D-Dimer, Quant: 1.98 ug/mL-FEU — ABNORMAL HIGH (ref 0.00–0.50)

## 2020-03-08 LAB — TROPONIN I (HIGH SENSITIVITY): Troponin I (High Sensitivity): 7 ng/L (ref <18)

## 2020-03-08 LAB — FERRITIN: Ferritin: 146 ng/mL (ref 24–336)

## 2020-03-08 MED ORDER — IPRATROPIUM-ALBUTEROL 20-100 MCG/ACT IN AERS
1.0000 | INHALATION_SPRAY | Freq: Three times a day (TID) | RESPIRATORY_TRACT | Status: DC
  Administered 2020-03-08 – 2020-03-09 (×5): 1 via RESPIRATORY_TRACT
  Filled 2020-03-08: qty 4

## 2020-03-08 NOTE — Progress Notes (Signed)
SATURATION QUALIFICATIONS: (This note is used to comply with regulatory documentation for home oxygen)  Patient Saturations on Room Air at Rest = 88%  Patient Saturations on Room Air while Ambulating = 83%  Patient Saturations on 4 Liters of oxygen while Ambulating = 88%  Please briefly explain why patient needs home oxygen: Resident will need oxygen during ambulation/exertion. At rest patient will require 2 L oxygen (sat O2 96%).

## 2020-03-08 NOTE — Progress Notes (Signed)
Physical Therapy Treatment Patient Details Name: Andrew Allen MRN: [id number] DOB: Dec 16, 1985 Today's Date: 03/08/2020    History of Present Illness 34 y/o presenting with +COVID-19 with fevers, headaches, generalized whole body aches, dizziness, dry persistent coughing, nausea with posttussive emesis, loss of appetite, no taste and shortness of breath. No significant PMH on file    PT Comments    Pt was able to walk the hallway today, but did need supplemental O2 and regular standing rest breaks for pursed lip breathing to maintain sats and dyspnea at acceptable levels (see details below). He is still very dyspnic at times with increased RR and dry cough.  Educated on frequent rest and level of O2 during today's walk.  PT will continue to follow acutely for safe mobility progression.   Follow Up Recommendations  No PT follow up     Equipment Recommendations  Other (comment) (home O2)    Recommendations for Other Services       Precautions / Restrictions Precautions Precautions: Other (comment) Precaution Comments: watch O2 sats    Mobility  Bed Mobility               General bed mobility comments: Pt was OOB in the recliner chair.   Transfers Overall transfer level: Needs assistance Equipment used: None Transfers: Sit to/from Stand Sit to Stand: Supervision         General transfer comment: supervision for safety and line managment.   Ambulation/Gait Ambulation/Gait assistance: Supervision Gait Distance (Feet): 150 Feet Assistive device: None Gait Pattern/deviations: Step-through pattern;Staggering left;Staggering right   Gait velocity interpretation: <1.8 ft/sec, indicate of risk for recurrent falls General Gait Details: Pt with mildly staggering, slow gait pattern, he had to take a standing rest break every 50' for pursed lip breathing as his sats were dropping to 86% and his DOE was increasing to 3/4.  With ~ 30 seconds of pursed lip breathing his  dyspnea was better and his sats were back to the low 90s on 2 L O2 [place]   Stairs             Wheelchair Mobility    Modified Rankin (Stroke Patients Only)       Balance Overall balance assessment: Mild deficits observed, not formally tested                                          Cognition Arousal/Alertness: Awake/alert Behavior During Therapy: Flat affect;WFL for tasks assessed/performed Overall Cognitive Status: Within Functional Limits for tasks assessed                                        Exercises Other Exercises Other Exercises: pt demonstrated IS x 10 reps with max inspired volume of 600 mL cues for slower, deeper inhales.     General Comments        Pertinent Vitals/Pain Pain Assessment: No/denies pain    Home Living                      Prior Function            PT Goals (current goals can now be found in the care plan section) Acute Rehab PT Goals Patient Stated Goal: return to independence Progress towards PT goals: Progressing toward goals  Frequency    Min 3X/week      PT Plan Current plan remains appropriate    Co-evaluation              AM-PAC PT "6 Clicks" Mobility   Outcome Measure  Help needed turning from your back to your side while in a flat bed without using bedrails?: None Help needed moving from lying on your back to sitting on the side of a flat bed without using bedrails?: None Help needed moving to and from a bed to a chair (including a wheelchair)?: None Help needed standing up from a chair using your arms (e.g., wheelchair or bedside chair)?: None Help needed to walk in hospital room?: None Help needed climbing 3-5 steps with a railing? : A Little 6 Click Score: 23    End of Session Equipment Utilized During Treatment: Oxygen;Other (comment) (teleinterpreter) Activity Tolerance: Patient limited by fatigue Patient left: in chair;with call bell/phone within  reach Nurse Communication: Mobility status PT Visit Diagnosis: Difficulty in walking, not elsewhere classified (R26.2);Muscle weakness (generalized) (M62.81);Unsteadiness on feet (R26.81)     Time: 4462-8638 PT Time Calculation (min) (ACUTE ONLY): 28 min  Charges:  $Gait Training: 23-37 mins                     Corinna Capra, PT, DPT  Acute Rehabilitation [phone] pager [phone]) [phone] office

## 2020-03-08 NOTE — Progress Notes (Signed)
PROGRESS NOTE                                                                                                                                                                                                             Patient Demographics:    Andrew Allen, is a 34 y.o. male, DOB - Aug 16, 1985, ZOX:[id number]  Outpatient Primary MD for the patient is Julieanne Manson, MD   Admit date - 02/28/2020   LOS - 9  Chief Complaint  Patient presents with  . Shortness of Breath       Brief Narrative: Patient is a 34 y.o. male with no PMHx presented to the hospital with approximately 1 week history of fever, cough, myalgias-he was diagnosed with COVID-19 on 7/27 (unvaccinated against COVID-19)-upon further evaluation-he was found to have severe hypoxemia requiring 15 L HFNC secondary to COVID-19 pneumonia.  Significant Events: 7/27>> COVID-19 positive 7/30>> admit to [facility] for severe hypoxemia requiring 15 L HF [place] secondary to COVID-19 pneumonia  Significant studies: 7/31>> chest x-ray: Multifocal pneumonia  COVID-19 medications: Steroids: 7/31>> Remdesivir: 7/31>> 8/4 Actemra: 7/31 x 1  Antibiotics: None  Microbiology data: 7/31>> blood culture: neg  Procedures: None  Consults: None  DVT prophylaxis: enoxaparin (LOVENOX) injection 40 mg Start: 02/28/20    Subjective:    Andrew Allen today is lying comfortably in bed-he feels better today-down to 9-10 L of HFNC.   Assessment  & Plan :   Acute Hypoxic Resp Failure due to Covid 19 Viral pneumonia: Had severe hypoxemia-at 1 point was on heated high flow-over the past few days-has had gradual improvement in his hypoxemia.  Today he was transitioned to just 2 L of oxygen.  Continue steroids-ambulate-monitor for 1 more day-if he continues to improve-suspect can be discharged home on 8/10.   Assess for home O2 requirement  Fever: afebrile O2  requirements:  SpO2: 95 % O2 Flow Rate (L/min): 2 L/min FiO2 (%): 80 %   COVID-19 Labs: Recent Labs    03/06/20 0455 03/07/20 0455 03/08/20 0152  DDIMER 1.42* 2.10* 1.98*  FERRITIN 137 140 146  CRP 0.5 0.6 0.9    No results found for: BNP  No results for input(s): PROCALCITON in the last 168 hours.  Lab Results  Component Value Date   SARSCOV2NAA POSITIVE (A) 02/24/2020    Prone/Incentive Spirometry: encouraged  patient to lie prone for 3-4 hours at a time for a total of 16 hours a day, and to encourage incentive spirometry use 3-4/hour.   Leukocytosis: Likely secondary to steroids-no indication of secondary bacterial infection.  Follow periodically.  Obesity: Estimated body mass index is 31.85 kg/m as calculated from the following:   Height as of this encounter: 5\' 2"  (1.575 m).   Weight as of this encounter: 79 kg.    GI prophylaxis: PPI  ABG: No results found for: PHART, PCO2ART, PO2ART, HCO3, TCO2, ACIDBASEDEF, O2SAT  Vent Settings: N/A  Condition -Guarded  Family Communication  : None at bedside-we will update family over the next few days.  Code Status :  Full Code  Diet :  Diet Order            Diet regular Room service appropriate? Yes; Fluid consistency: Thin  Diet effective now                  Disposition Plan  :  Status is: Inpatient  Remains inpatient appropriate because:Inpatient level of care appropriate due to severity of illness   Dispo: The patient is from: Home              Anticipated d/c is to: Home              Anticipated d/c date is: > 1 days              Patient currently is not medically stable to d/c.   Barriers to discharge: Severe hypoxia requiring O2 supplementation  Antimicorbials  :    Anti-infectives (From admission, onward)   Start     Dose/Rate Route Frequency Ordered Stop   02/29/20 1000  remdesivir 100 mg in sodium chloride 0.9 % 100 mL IVPB       "Followed by" Linked Group Details   100 mg 200 mL/hr  over 30 Minutes Intravenous Daily 02/28/20 0514 03/03/20 1118   02/28/20 0630  remdesivir 200 mg in sodium chloride 0.9% 250 mL IVPB       "Followed by" Linked Group Details   200 mg 580 mL/hr over 30 Minutes Intravenous Once 02/28/20 0514 02/28/20 0941      Inpatient Medications  Scheduled Meds: . chlorpheniramine-HYDROcodone  5 mL Oral Q12H  . enoxaparin (LOVENOX) injection  40 mg Subcutaneous Q24H  . Ipratropium-Albuterol  1 puff Inhalation TID  . methylPREDNISolone (SOLU-MEDROL) injection  40 mg Intravenous Daily   Continuous Infusions:  PRN Meds:.acetaminophen **OR** acetaminophen, albuterol, guaiFENesin-dextromethorphan, ondansetron **OR** ondansetron (ZOFRAN) IV, phenol, polyethylene glycol   Time Spent in minutes  25   See all Orders from today for further details   Jeoffrey MassedShanker Caterra Ostroff M.D on 03/08/2020 at 2:00 PM  To page go to [email or web address] - use universal password  [facility] -  Office  [phone]    Objective:   Vitals:   03/08/20 0532 03/08/20 0540 03/08/20 0738 03/08/20 1236  BP: 98/67 100/63  106/76  Pulse: 67 65  79  Resp: (!) 24 (!) 24 19 (!) 21  Temp: 97.7 F (36.5 C) 97.7 F (36.5 C) 97.9 F (36.6 C) 97.8 F (36.6 C)  TempSrc: Oral Oral Oral Oral  SpO2: 90% 94%  95%  Weight:      Height:        Wt Readings from Last 3 Encounters:  03/03/20 79 kg  05/19/15 81.2 kg     Intake/Output Summary (Last 24 hours) at 03/08/2020 1400 Last data filed at 03/08/2020  1324 Gross per 24 hour  Intake 300 ml  Output 2250 ml  Net -1950 ml     Physical Exam Gen Exam:Alert awake-not in any distress HEENT:atraumatic, normocephalic Chest: B/L clear to auscultation anteriorly CVS:S1S2 regular Abdomen:soft non tender, non distended Extremities:no edema Neurology: Non focal Skin: no rash   Data Review:    CBC Recent Labs  Lab 03/07/20 0455 03/08/20 0152  WBC 16.3* 16.3*  HGB 14.4 14.6  HCT 43.7 43.2  PLT 300 275  MCV 88.6 89.4  MCH 29.2  30.2  MCHC 33.0 33.8  RDW 13.7 13.4    Chemistries  Recent Labs  Lab 03/04/20 0407 03/05/20 0342 03/06/20 0455 03/07/20 0455 03/08/20 0152  NA 133* 131* 135 133* 134*  K 5.0 4.9 3.9 4.0 4.0  CL 97* 96* 97* 97* 98  CO2 28 26 29 27 27   GLUCOSE 188* 185* 120* 115* 189*  BUN 17 18 17 16 17   CREATININE 0.85 0.88 0.83 0.68 0.86  CALCIUM 8.6* 8.5* 8.4* 8.5* 8.4*  AST 27 25 18 19 18   ALT 71* 57* 42 37 36  ALKPHOS 58 61 55 58 74  BILITOT 1.0 1.2 0.9 1.0 0.6   ------------------------------------------------------------------------------------------------------------------ No results for input(s): CHOL, HDL, LDLCALC, TRIG, CHOLHDL, LDLDIRECT in the last 72 hours.  No results found for: HGBA1C ------------------------------------------------------------------------------------------------------------------ No results for input(s): TSH, T4TOTAL, T3FREE, THYROIDAB in the last 72 hours.  Invalid input(s): FREET3 ------------------------------------------------------------------------------------------------------------------ Recent Labs    03/07/20 0455 03/08/20 0152  FERRITIN 140 146    Coagulation profile No results for input(s): INR, PROTIME in the last 168 hours.  Recent Labs    03/07/20 0455 03/08/20 0152  DDIMER 2.10* 1.98*    Cardiac Enzymes No results for input(s): CKMB, TROPONINI, MYOGLOBIN in the last 168 hours.  Invalid input(s): CK ------------------------------------------------------------------------------------------------------------------ No results found for: BNP  Micro Results Recent Results (from the past 240 hour(s))  Blood Culture (routine x 2)     Status: None   Collection Time: 02/28/20  1:45 AM   Specimen: BLOOD  Result Value Ref Range Status   Specimen Description BLOOD RIGHT HAND  Final   Special Requests   Final    BOTTLES DRAWN AEROBIC AND ANAEROBIC Blood Culture adequate volume   Culture   Final    NO GROWTH 5 DAYS Performed at  [facility], [address], [place], [facility] [address]    Report Status 03/04/2020 FINAL  Final  Blood Culture (routine x 2)     Status: None   Collection Time: 02/28/20  1:45 AM   Specimen: BLOOD  Result Value Ref Range Status   Specimen Description BLOOD RIGHT ARM  Final   Special Requests   Final    BOTTLES DRAWN AEROBIC AND ANAEROBIC Blood Culture results may not be optimal due to an excessive volume of blood received in culture bottles   Culture   Final    NO GROWTH 5 DAYS Performed at [facility], [address], [place], [facility] [address]    Report Status 03/04/2020 FINAL  Final    Radiology Reports DG Chest Port 1 View  Result Date: 02/28/2020 CLINICAL DATA:  COVID positive he EXAM: PORTABLE CHEST 1 VIEW COMPARISON:  None. FINDINGS: The heart size and mediastinal contours are within normal limits. Multifocal patchy airspace opacities are seen throughout both lungs, predominantly within the periphery of the left lung. No pleural effusion. No acute osseous abnormality. IMPRESSION: Multifocal airspace opacities, left greater than right, consistent  with multifocal pneumonia Electronically Signed   By: Jonna Clark M.D.   On: 02/28/2020 01:26

## 2020-03-08 NOTE — Progress Notes (Addendum)
Floor coverage   Nursing staff reported a brief episode of ST elevation on telemetry.  Patient is asymptomatic and resting comfortably.  No complaints of chest pain or shortness of breath.  -Stat EKG and high-sensitivity troponin ordered  Update: EKG with sinus rhythm, LVH, nonspecific T wave abnormality.  Not significantly changed from prior tracing.  High-sensitivity troponin negative.

## 2020-03-09 DIAGNOSIS — U071 COVID-19: Principal | ICD-10-CM

## 2020-03-09 DIAGNOSIS — J9600 Acute respiratory failure, unspecified whether with hypoxia or hypercapnia: Secondary | ICD-10-CM

## 2020-03-09 LAB — CBC
HCT: 45.1 % (ref 39.0–52.0)
Hemoglobin: 14.9 g/dL (ref 13.0–17.0)
MCH: 29.6 pg (ref 26.0–34.0)
MCHC: 33 g/dL (ref 30.0–36.0)
MCV: 89.5 fL (ref 80.0–100.0)
Platelets: 267 10*3/uL (ref 150–400)
RBC: 5.04 MIL/uL (ref 4.22–5.81)
RDW: 13.7 % (ref 11.5–15.5)
WBC: 13.8 10*3/uL — ABNORMAL HIGH (ref 4.0–10.5)
nRBC: 0 % (ref 0.0–0.2)

## 2020-03-09 MED ORDER — BENZONATATE 200 MG PO CAPS: 200.0000 mg | ORAL_CAPSULE | Freq: Three times a day (TID) | ORAL | 0 refills | Status: DC

## 2020-03-09 MED ORDER — ALBUTEROL SULFATE HFA 108 (90 BASE) MCG/ACT IN AERS: 2.0000 | INHALATION_SPRAY | RESPIRATORY_TRACT | 0 refills | Status: DC | PRN

## 2020-03-09 MED ORDER — HYDROCOD POLST-CPM POLST ER 10-8 MG/5ML PO SUER: 5.0000 mL | Freq: Two times a day (BID) | ORAL | 0 refills | Status: DC | PRN

## 2020-03-09 MED ORDER — BENZONATATE 100 MG PO CAPS
200.0000 mg | ORAL_CAPSULE | Freq: Three times a day (TID) | ORAL | Status: DC
  Administered 2020-03-09: 200 mg via ORAL
  Filled 2020-03-09: qty 2

## 2020-03-09 MED ORDER — PREDNISONE 10 MG PO TABS
ORAL_TABLET | ORAL | 0 refills | Status: DC
Start: 2020-03-09 — End: 2020-04-09

## 2020-03-09 MED FILL — predniSONE 10 MG TABS: 10 | 4 days supply | Qty: 10 | Fill #0

## 2020-03-09 MED FILL — ALBUTEROL SULFATE HFA 108 (: 108 (90 BAS | 10 days supply | Qty: 18 | Fill #0

## 2020-03-09 MED FILL — HYDROCODONE-CHLORPHEN ER SU: 10-8 | 7 days supply | Qty: 70 | Fill #0

## 2020-03-09 MED FILL — BENZONATATE 200 MG CAPS: 200 | 6 days supply | Qty: 20 | Fill #0

## 2020-03-09 NOTE — Discharge Instructions (Signed)
Person Under Monitoring Name: Andrew Allen  Location: [address] [place] [facility] [id number]   Infection Prevention Recommendations for Individuals Confirmed to have, or Being Evaluated for, 2019 Novel Coronavirus (COVID-19) Infection Who Receive Care at Home  Individuals who are confirmed to have, or are being evaluated for, COVID-19 should follow the prevention steps below until a healthcare provider or local or state health department says they can return to normal activities.  Stay home except to get medical care You should restrict activities outside your home, except for getting medical care. Do not go to work, school, or public areas, and do not use public transportation or taxis.  Call ahead before visiting your doctor Before your medical appointment, call the healthcare provider and tell them that you have, or are being evaluated for, COVID-19 infection. This will help the healthcare provider's office take steps to keep other people from getting infected. Ask your healthcare provider to call the local or state health department.  Monitor your symptoms Seek prompt medical attention if your illness is worsening (e.g., difficulty breathing). Before going to your medical appointment, call the healthcare provider and tell them that you have, or are being evaluated for, COVID-19 infection. Ask your healthcare provider to call the local or state health department.  Wear a facemask You should wear a facemask that covers your nose and mouth when you are in the same room with other people and when you visit a healthcare provider. People who live with or visit you should also wear a facemask while they are in the same room with you.  Separate yourself from other people in your home As much as possible, you should stay in a different room from other people in your home. Also, you should use a separate bathroom, if available.  Avoid sharing household items You  should not share dishes, drinking glasses, cups, eating utensils, towels, bedding, or other items with other people in your home. After using these items, you should wash them thoroughly with soap and water.  Cover your coughs and sneezes Cover your mouth and nose with a tissue when you cough or sneeze, or you can cough or sneeze into your sleeve. Throw used tissues in a lined trash can, and immediately wash your hands with soap and water for at least 20 seconds or use an alcohol-based hand rub.  Wash your [employer] your hands often and thoroughly with soap and water for at least 20 seconds. You can use an alcohol-based hand sanitizer if soap and water are not available and if your hands are not visibly dirty. Avoid touching your eyes, nose, and mouth with unwashed hands.   Prevention Steps for Caregivers and Household Members of Individuals Confirmed to have, or Being Evaluated for, COVID-19 Infection Being Cared for in the Home  If you live with, or provide care at home for, a person confirmed to have, or being evaluated for, COVID-19 infection please follow these guidelines to prevent infection:  Follow healthcare provider's instructions Make sure that you understand and can help the patient follow any healthcare provider instructions for all care.  Provide for the patient's basic needs You should help the patient with basic needs in the home and provide support for getting groceries, prescriptions, and other personal needs.  Monitor the patient's symptoms If they are getting sicker, call his or her medical provider and tell them that the patient has, or is being evaluated for, COVID-19 infection. This will help the healthcare provider's office  take steps to keep other people from getting infected. Ask the healthcare provider to call the local or state health department.  Limit the number of people who have contact with the patient  If possible, have only one caregiver for the  patient.  Other household members should stay in another home or place of residence. If this is not possible, they should stay  in another room, or be separated from the patient as much as possible. Use a separate bathroom, if available.  Restrict visitors who do not have an essential need to be in the home.  Keep older adults, very young children, and other sick people away from the patient Keep older adults, very young children, and those who have compromised immune systems or chronic health conditions away from the patient. This includes people with chronic heart, lung, or kidney conditions, diabetes, and cancer.  Ensure good ventilation Make sure that shared spaces in the home have good air flow, such as from an air conditioner or an opened window, weather permitting.  Wash your hands often  Wash your hands often and thoroughly with soap and water for at least 20 seconds. You can use an alcohol based hand sanitizer if soap and water are not available and if your hands are not visibly dirty.  Avoid touching your eyes, nose, and mouth with unwashed hands.  Use disposable paper towels to dry your hands. If not available, use dedicated cloth towels and replace them when they become wet.  Wear a facemask and gloves  Wear a disposable facemask at all times in the room and gloves when you touch or have contact with the patient's blood, body fluids, and/or secretions or excretions, such as sweat, saliva, sputum, nasal mucus, vomit, urine, or feces.  Ensure the mask fits over your nose and mouth tightly, and do not touch it during use.  Throw out disposable facemasks and gloves after using them. Do not reuse.  Wash your hands immediately after removing your facemask and gloves.  If your personal clothing becomes contaminated, carefully remove clothing and launder. Wash your hands after handling contaminated clothing.  Place all used disposable facemasks, gloves, and other waste in a lined  container before disposing them with other household waste.  Remove gloves and wash your hands immediately after handling these items.  Do not share dishes, glasses, or other household items with the patient  Avoid sharing household items. You should not share dishes, drinking glasses, cups, eating utensils, towels, bedding, or other items with a patient who is confirmed to have, or being evaluated for, COVID-19 infection.  After the person uses these items, you should wash them thoroughly with soap and water.  Wash laundry thoroughly  Immediately remove and wash clothes or bedding that have blood, body fluids, and/or secretions or excretions, such as sweat, saliva, sputum, nasal mucus, vomit, urine, or feces, on them.  Wear gloves when handling laundry from the patient.  Read and follow directions on labels of laundry or clothing items and detergent. In general, wash and dry with the warmest temperatures recommended on the label.  Clean all areas the individual has used often  Clean all touchable surfaces, such as counters, tabletops, doorknobs, bathroom fixtures, toilets, phones, keyboards, tablets, and bedside tables, every day. Also, clean any surfaces that may have blood, body fluids, and/or secretions or excretions on them.  Wear gloves when cleaning surfaces the patient has come in contact with.  Use a diluted bleach solution (e.g., dilute bleach with 1 part  bleach and 10 parts water) or a household disinfectant with a label that says EPA-registered for coronaviruses. To make a bleach solution at home, add 1 tablespoon of bleach to 1 quart (4 cups) of water. For a larger supply, add  cup of bleach to 1 gallon (16 cups) of water.  Read labels of cleaning products and follow recommendations provided on product labels. Labels contain instructions for safe and effective use of the cleaning product including precautions you should take when applying the product, such as wearing gloves or  eye protection and making sure you have good ventilation during use of the product.  Remove gloves and wash hands immediately after cleaning.  Monitor yourself for signs and symptoms of illness Caregivers and household members are considered close contacts, should monitor their health, and will be asked to limit movement outside of the home to the extent possible. Follow the monitoring steps for close contacts listed on the symptom monitoring form.   ? If you have additional questions, contact your local health department or call the epidemiologist on call at [phone] (available 24/7). ? This guidance is subject to change. For the most up-to-date guidance from [facility], please refer to their website: [email or web address]

## 2020-03-09 NOTE — TOC Progression Note (Addendum)
Transition of Care [facility]) - Progression Note    Patient Details  Name: Andrew Allen MRN: [id number] Date of Birth: Jul 03, 1986  [facility] CM/SW Contact  Beckie Busing, RN Phone Number: [phone]  03/09/2020, 10:07 AM  Clinical Narrative:   MATCH form complete for 30 day supply of meds from [facility] pharmacy. [facility] has been notified for Home O2 for charity patient with no insurance. Patient has script for Tussionex however MATCH override can not be done for the $35 dollar cost. Override for narcotics is only for surgical and trauma patients. Pharmacy has been made aware. TOC will continue to follow. Follow up appointment has been scheduled  with [facility], details added to avs.       Expected Discharge Plan and Services           Expected Discharge Date: 03/09/20                                     Social Determinants of Health (SDOH) Interventions    Readmission Risk Interventions No flowsheet data found.

## 2020-03-09 NOTE — Progress Notes (Addendum)
Patient was discharged home by MD order; discharged instructions review and give to patient with care notes; medication from [facility] pharmacy and oxygen tank were delivered to patient's room; IV DIC; skin intact; patient will be escorted to the car by nurse tech via wheelchair.

## 2020-03-09 NOTE — Discharge Summary (Signed)
PATIENT DETAILS Name: Andrew Allen Age: 34 y.o. Sex: male Date of Birth: 08/21/1985 MRN: [id number]. Admitting Physician: Gery Prayebby Crosley, MD NFA:[id number]:Mulberry, [name], MD  Admit Date: 02/28/2020 Discharge date: 03/09/2020  Recommendations for Outpatient Follow-up:  1. Follow up with PCP in 1-2 weeks 2. Please obtain CMP/CBC in one week 3. Repeat Chest Xray in 4-6 week 4. Please re-assess at follow up visit whether patient still requires Home O2  Admitted From:  Home  Disposition: Home   Home Health: No  Equipment/Devices: oxygen 2L at rest, 4L with ambulation  Discharge Condition: Stable  CODE STATUS: FULL CODE  Diet recommendation:  Diet Order            Diet general           Diet regular Room service appropriate? Yes; Fluid consistency: Thin  Diet effective now                  Brief Narrative: Patient is a 34 y.o. male with no PMHx presented to the hospital with approximately 1 week history of fever, cough, myalgias-he was diagnosed with COVID-19 on 7/27 (unvaccinated against COVID-19)-upon further evaluation-he was found to have severe hypoxemia requiring 15 L HFNC secondary to COVID-19 pneumonia.  Significant Events: 7/27>> COVID-19 positive 7/30>> [facility] hypoxemia requiring 15 L HF [place] secondary to COVID-19 pneumonia  Significant studies: 7/31>> chest x-ray: Multifocal pneumonia  COVID-19 medications: Steroids: 7/31>> Remdesivir: 7/31>> 8/4 Actemra: 7/31 x 1  Antibiotics: None  Microbiology data: 7/31>> blood culture: neg  Procedures: None  Consults: None  Brief Hospital Course: Acute Hypoxic Resp Failure due to Covid 19 Viral pneumonia: Had severe hypoxemia-at one point was on heated high flow-over the past few days-has had gradual improvement in his hypoxemia. Since yesterday-now stable on just 2L of O2,he feels much better and wants to go home.  Patient claims that when he walked with physical therapy  yesterday-he really did not feel that short of breath-Per nursing staff-requires around 4 L of oxygen with ambulation and just 2 L of oxygen with rest.  Since he is clinically improved-stable for discharge-Case manager will arrange home O2 and follow-up appointment.  PCP will need to reassess at follow-up visit whether patient still requires oxygen or not.  COVID-19 Labs:  Recent Labs    03/07/20 0455 03/08/20 0152  DDIMER 2.10* 1.98*  FERRITIN 140 146  CRP 0.6 0.9    Lab Results  Component Value Date   SARSCOV2NAA POSITIVE (A) 02/24/2020     Leukocytosis: Likely secondary to steroids-no indication of secondary bacterial infection.  Follow periodically.  Obesity: Estimated body mass index is 31.85 kg/m as calculated from the following:   Height as of this encounter: 5\' 2"  (1.575 m).   Weight as of this encounter: 79 kg. N/A   Discharge Diagnoses:  Principal Problem:   Acute respiratory failure due to COVID-19 [facility](HCC) Active Problems:   Pneumonia due to COVID-19 virus   Hyperkalemia   Hyponatremia   Elevated LFTs   Discharge Instructions:    Person Under Monitoring Name: Andrew Allen  [address] [place] [facility] [place] [id number]   Infection Prevention Recommendations for Individuals Confirmed to have, or Being Evaluated for, 2019 Novel Coronavirus (COVID-19) Infection Who Receive Care at Home  Individuals who are confirmed to have, or are being evaluated for, COVID-19 should follow the prevention steps below until a healthcare provider or local or state health department says they can return to normal activities.  Stay home except to get medical care You should restrict activities outside your home, except for getting medical care. Do not go to work, school, or public areas, and do not use public transportation or taxis.  Call ahead before visiting your doctor Before your medical appointment, call the healthcare provider and tell them that  you have, or are being evaluated for, COVID-19 infection. This will help the healthcare provider's office take steps to keep other people from getting infected. Ask your healthcare provider to call the local or state health department.  Monitor your symptoms Seek prompt medical attention if your illness is worsening (e.g., difficulty breathing). Before going to your medical appointment, call the healthcare provider and tell them that you have, or are being evaluated for, COVID-19 infection. Ask your healthcare provider to call the local or state health department.  Wear a facemask You should wear a facemask that covers your nose and mouth when you are in the same room with other people and when you visit a healthcare provider. People who live with or visit you should also wear a facemask while they are in the same room with you.  Separate yourself from other people in your home As much as possible, you should stay in a different room from other people in your home. Also, you should use a separate bathroom, if available.  Avoid sharing household items You should not share dishes, drinking glasses, cups, eating utensils, towels, bedding, or other items with other people in your home. After using these items, you should wash them thoroughly with soap and water.  Cover your coughs and sneezes Cover your mouth and nose with a tissue when you cough or sneeze, or you can cough or sneeze into your sleeve. Throw used tissues in a lined trash can, and immediately wash your hands with soap and water for at least 20 seconds or use an alcohol-based hand rub.  Wash your [employer] your hands often and thoroughly with soap and water for at least 20 seconds. You can use an alcohol-based hand sanitizer if soap and water are not available and if your hands are not visibly dirty. Avoid touching your eyes, nose, and mouth with unwashed hands.   Prevention Steps for Caregivers and Household Members  of Individuals Confirmed to have, or Being Evaluated for, COVID-19 Infection Being Cared for in the Home  If you live with, or provide care at home for, a person confirmed to have, or being evaluated for, COVID-19 infection please follow these guidelines to prevent infection:  Follow healthcare provider's instructions Make sure that you understand and can help the patient follow any healthcare provider instructions for all care.  Provide for the patient's basic needs You should help the patient with basic needs in the home and provide support for getting groceries, prescriptions, and other personal needs.  Monitor the patient's symptoms If they are getting sicker, call his or her medical provider and tell them that the patient has, or is being evaluated for, COVID-19 infection. This will help the healthcare provider's office take steps to keep other people from getting infected. Ask the healthcare provider to call the local or state health department.  Limit the number of people who have contact with the patient  If possible, have only one caregiver for the patient.  Other household members should stay in another home or place of residence. If this is not possible, they should stay  in another room, or be separated from the patient as much as  possible. Use a separate bathroom, if available.  Restrict visitors who do not have an essential need to be in the home.  Keep older adults, very young children, and other sick people away from the patient Keep older adults, very young children, and those who have compromised immune systems or chronic health conditions away from the patient. This includes people with chronic heart, lung, or kidney conditions, diabetes, and cancer.  Ensure good ventilation Make sure that shared spaces in the home have good air flow, such as from an air conditioner or an opened window, weather permitting.  Wash your hands often  Wash your hands often and  thoroughly with soap and water for at least 20 seconds. You can use an alcohol based hand sanitizer if soap and water are not available and if your hands are not visibly dirty.  Avoid touching your eyes, nose, and mouth with unwashed hands.  Use disposable paper towels to dry your hands. If not available, use dedicated cloth towels and replace them when they become wet.  Wear a facemask and gloves  Wear a disposable facemask at all times in the room and gloves when you touch or have contact with the patient's blood, body fluids, and/or secretions or excretions, such as sweat, saliva, sputum, nasal mucus, vomit, urine, or feces.  Ensure the mask fits over your nose and mouth tightly, and do not touch it during use.  Throw out disposable facemasks and gloves after using them. Do not reuse.  Wash your hands immediately after removing your facemask and gloves.  If your personal clothing becomes contaminated, carefully remove clothing and launder. Wash your hands after handling contaminated clothing.  Place all used disposable facemasks, gloves, and other waste in a lined container before disposing them with other household waste.  Remove gloves and wash your hands immediately after handling these items.  Do not share dishes, glasses, or other household items with the patient  Avoid sharing household items. You should not share dishes, drinking glasses, cups, eating utensils, towels, bedding, or other items with a patient who is confirmed to have, or being evaluated for, COVID-19 infection.  After the person uses these items, you should wash them thoroughly with soap and water.  Wash laundry thoroughly  Immediately remove and wash clothes or bedding that have blood, body fluids, and/or secretions or excretions, such as sweat, saliva, sputum, nasal mucus, vomit, urine, or feces, on them.  Wear gloves when handling laundry from the patient.  Read and follow directions on labels of laundry or  clothing items and detergent. In general, wash and dry with the warmest temperatures recommended on the label.  Clean all areas the individual has used often  Clean all touchable surfaces, such as counters, tabletops, doorknobs, bathroom fixtures, toilets, phones, keyboards, tablets, and bedside tables, every day. Also, clean any surfaces that may have blood, body fluids, and/or secretions or excretions on them.  Wear gloves when cleaning surfaces the patient has come in contact with.  Use a diluted bleach solution (e.g., dilute bleach with 1 part bleach and 10 parts water) or a household disinfectant with a label that says EPA-registered for coronaviruses. To make a bleach solution at home, add 1 tablespoon of bleach to 1 quart (4 cups) of water. For a larger supply, add  cup of bleach to 1 gallon (16 cups) of water.  Read labels of cleaning products and follow recommendations provided on product labels. Labels contain instructions for safe and effective use of the cleaning product  including precautions you should take when applying the product, such as wearing gloves or eye protection and making sure you have good ventilation during use of the product.  Remove gloves and wash hands immediately after cleaning.  Monitor yourself for signs and symptoms of illness Caregivers and household members are considered close contacts, should monitor their health, and will be asked to limit movement outside of the home to the extent possible. Follow the monitoring steps for close contacts listed on the symptom monitoring form.   ? If you have additional questions, contact your local health department or call the epidemiologist on call at [phone] (available 24/7). ? This guidance is subject to change. For the most up-to-date guidance from [facility], please refer to their website: [employer]hu    Activity:  As tolerated   Discharge  Instructions    Call MD for:  difficulty breathing, headache or visual disturbances   Complete by: As directed    Call MD for:  extreme fatigue   Complete by: As directed    Call MD for:  persistant dizziness or light-headedness   Complete by: As directed    Call MD for:  persistant nausea and vomiting   Complete by: As directed    Diet general   Complete by: As directed    Discharge instructions   Complete by: As directed    Follow with Primary MD in 1-2 weeks  Use Oxygen 24/7 as prescribed-follow with your Primary MD at next visit to see if you can be titrated off O2  Please get a complete blood count and chemistry panel checked by your Primary MD at your next visit, and again as instructed by your Primary MD.  Get Medicines reviewed and adjusted: Please take all your medications with you for your next visit with your Primary MD  Laboratory/radiological data: Please request your Primary MD to go over all hospital tests and procedure/radiological results at the follow up, please ask your Primary MD to get all Hospital records sent to his/her office.  In some cases, they will be blood work, cultures and biopsy results pending at the time of your discharge. Please request that your primary care M.D. follows up on these results.  Also Note the following: If you experience worsening of your admission symptoms, develop shortness of breath, life threatening emergency, suicidal or homicidal thoughts you must seek medical attention immediately by calling 911 or calling your MD immediately  if symptoms less severe.  You must read complete instructions/literature along with all the possible adverse reactions/side effects for all the Medicines you take and that have been prescribed to you. Take any new Medicines after you have completely understood and accpet all the possible adverse reactions/side effects.   Do not drive when taking Pain medications or sleeping medications  (Benzodaizepines)  Do not take more than prescribed Pain, Sleep and Anxiety Medications. It is not advisable to combine anxiety,sleep and pain medications without talking with your primary care practitioner  Special Instructions: If you have smoked or chewed Tobacco  in the last 2 yrs please stop smoking, stop any regular Alcohol  and or any Recreational drug use.  Wear Seat belts while driving.  Please note: You were cared for by a hospitalist during your hospital stay. Once you are discharged, your primary care physician will handle any further medical issues. Please note that NO REFILLS for any discharge medications will be authorized once you are discharged, as it is imperative that you return to your primary care physician (  or establish a relationship with a primary care physician if you do not have one) for your post hospital discharge needs so that they can reassess your need for medications and monitor your lab values.   1. 3 weeks of isolation for 02/24/20   Increase activity slowly   Complete by: As directed      Allergies as of 03/09/2020   No Known Allergies     Medication List    STOP taking these medications   cetirizine 10 MG tablet Commonly known as: ZyrTEC Allergy   promethazine-dextromethorphan 6.25-15 MG/5ML syrup Commonly known as: PROMETHAZINE-DM     TAKE these medications   albuterol 108 (90 Base) MCG/ACT inhaler Commonly known as: VENTOLIN HFA Inhale 2 puffs into the lungs every 2 (two) hours as needed for wheezing or shortness of breath.   benzonatate 200 MG capsule Commonly known as: TESSALON Take 1 capsule (200 mg total) by mouth 3 (three) times daily. What changed:   medication strength  how much to take  when to take this  reasons to take this   chlorpheniramine-HYDROcodone 10-8 MG/5ML Suer Commonly known as: TUSSIONEX Take 5 mLs by mouth every 12 (twelve) hours as needed for cough.   predniSONE 10 MG tablet Commonly known as:  DELTASONE Take 40 mg daily for 1 day, 30 mg daily for 1 day, 20 mg daily for 1 days,10 mg daily for 1 day, then stop            Durable Medical Equipment  (From admission, onward)         Start     Ordered   03/08/20 1710  For home use only DME oxygen  Once       Comments: 2L with rest and 4L with ambulation  Question Answer Comment  Length of Need 6 Months   Mode or (Route) Nasal cannula   Liters per Minute 4   Frequency Continuous (stationary and portable oxygen unit needed)   Oxygen conserving device Yes   Oxygen delivery system Gas      03/08/20 1710          Follow-up Information    Julieanne Manson, MD. Schedule an appointment as soon as possible for a visit in 1 week(s).   Specialty: Internal Medicine Contact information: [address] [place] [address] [phone]              No Known Allergies     Other Procedures/Studies: DG Chest Port 1 View  Result Date: 02/28/2020 CLINICAL DATA:  COVID positive he EXAM: PORTABLE CHEST 1 VIEW COMPARISON:  None. FINDINGS: The heart size and mediastinal contours are within normal limits. Multifocal patchy airspace opacities are seen throughout both lungs, predominantly within the periphery of the left lung. No pleural effusion. No acute osseous abnormality. IMPRESSION: Multifocal airspace opacities, left greater than right, consistent with multifocal pneumonia Electronically Signed   By: Jonna Clark M.D.   On: 02/28/2020 01:26     TODAY-DAY OF DISCHARGE:  Subjective:   Andrew Allen today has no headache,no chest abdominal pain,no new weakness tingling or numbness, feels much better wants to go home today.   Objective:   Blood pressure 110/74, pulse 68, temperature 98 F (36.7 C), temperature source Oral, resp. rate (!) 21, height 5\' 2"  (1.575 m), weight 79 kg, SpO2 96 %.  Intake/Output Summary (Last 24 hours) at 03/09/2020 1017 Last data filed at 03/09/2020 0856 Gross per 24 hour   Intake 120 ml  Output 1400  ml  Net -1280 ml   Filed Weights   02/28/20 0057 03/03/20 0347  Weight: 81.2 kg 79 kg    Exam: Awake Alert, Oriented *3, No new F.N deficits, Normal affect Page.AT,PERRAL Supple Neck,No JVD, No cervical lymphadenopathy appriciated.  Symmetrical Chest wall movement, Good air movement bilaterally, CTAB RRR,No Gallops,Rubs or new Murmurs, No Parasternal Heave +ve B.Sounds, Abd Soft, Non tender, No organomegaly appriciated, No rebound -guarding or rigidity. No Cyanosis, Clubbing or edema, No new Rash or bruise   PERTINENT RADIOLOGIC STUDIES: DG Chest Port 1 View  Result Date: 02/28/2020 CLINICAL DATA:  COVID positive he EXAM: PORTABLE CHEST 1 VIEW COMPARISON:  None. FINDINGS: The heart size and mediastinal contours are within normal limits. Multifocal patchy airspace opacities are seen throughout both lungs, predominantly within the periphery of the left lung. No pleural effusion. No acute osseous abnormality. IMPRESSION: Multifocal airspace opacities, left greater than right, consistent with multifocal pneumonia Electronically Signed   By: Jonna Clark M.D.   On: 02/28/2020 01:26     PERTINENT LAB RESULTS: CBC: Recent Labs    03/08/20 0152 03/09/20 0500  WBC 16.3* 13.8*  HGB 14.6 14.9  HCT 43.2 45.1  PLT 275 267   CMET CMP     Component Value Date/Time   NA 134 (L) 03/08/2020 0152   K 4.0 03/08/2020 0152   CL 98 03/08/2020 0152   CO2 27 03/08/2020 0152   GLUCOSE 189 (H) 03/08/2020 0152   BUN 17 03/08/2020 0152   CREATININE 0.86 03/08/2020 0152   CALCIUM 8.4 (L) 03/08/2020 0152   PROT 5.8 (L) 03/08/2020 0152   ALBUMIN 2.8 (L) 03/08/2020 0152   AST 18 03/08/2020 0152   ALT 36 03/08/2020 0152   ALKPHOS 74 03/08/2020 0152   BILITOT 0.6 03/08/2020 0152   GFRNONAA >60 03/08/2020 0152   GFRAA >60 03/08/2020 0152    GFR Estimated Creatinine Clearance: 108.2 mL/min (by C-G formula based on SCr of 0.86 mg/dL). No results for input(s): LIPASE,  AMYLASE in the last 72 hours. No results for input(s): CKTOTAL, CKMB, CKMBINDEX, TROPONINI in the last 72 hours. Invalid input(s): POCBNP Recent Labs    03/07/20 0455 03/08/20 0152  DDIMER 2.10* 1.98*   No results for input(s): HGBA1C in the last 72 hours. No results for input(s): CHOL, HDL, LDLCALC, TRIG, CHOLHDL, LDLDIRECT in the last 72 hours. No results for input(s): TSH, T4TOTAL, T3FREE, THYROIDAB in the last 72 hours.  Invalid input(s): FREET3 Recent Labs    03/07/20 0455 03/08/20 0152  FERRITIN 140 146   Coags: No results for input(s): INR in the last 72 hours.  Invalid input(s): PT Microbiology: No results found for this or any previous visit (from the past 240 hour(s)).  FURTHER DISCHARGE INSTRUCTIONS:  Get Medicines reviewed and adjusted: Please take all your medications with you for your next visit with your Primary MD  Laboratory/radiological data: Please request your Primary MD to go over all hospital tests and procedure/radiological results at the follow up, please ask your Primary MD to get all Hospital records sent to his/her office.  In some cases, they will be blood work, cultures and biopsy results pending at the time of your discharge. Please request that your primary care M.D. goes through all the records of your hospital data and follows up on these results.  Also Note the following: If you experience worsening of your admission symptoms, develop shortness of breath, life threatening emergency, suicidal or homicidal thoughts you must seek medical attention immediately by  calling 911 or calling your MD immediately  if symptoms less severe.  You must read complete instructions/literature along with all the possible adverse reactions/side effects for all the Medicines you take and that have been prescribed to you. Take any new Medicines after you have completely understood and accpet all the possible adverse reactions/side effects.   Do not drive when  taking Pain medications or sleeping medications (Benzodaizepines)  Do not take more than prescribed Pain, Sleep and Anxiety Medications. It is not advisable to combine anxiety,sleep and pain medications without talking with your primary care practitioner  Special Instructions: If you have smoked or chewed Tobacco  in the last 2 yrs please stop smoking, stop any regular Alcohol  and or any Recreational drug use.  Wear Seat belts while driving.  Please note: You were cared for by a hospitalist during your hospital stay. Once you are discharged, your primary care physician will handle any further medical issues. Please note that NO REFILLS for any discharge medications will be authorized once you are discharged, as it is imperative that you return to your primary care physician (or establish a relationship with a primary care physician if you do not have one) for your post hospital discharge needs so that they can reassess your need for medications and monitor your lab values.  Total Time spent coordinating discharge including counseling, education and face to face time equals 355 minutes.  SignedJeoffrey Massed 03/09/2020 10:17 AM

## 2020-04-09 ENCOUNTER — Other Ambulatory Visit: Payer: Self-pay

## 2020-04-09 ENCOUNTER — Ambulatory Visit (HOSPITAL_COMMUNITY)
Admission: RE | Admit: 2020-04-09 | Discharge: 2020-04-09 | Disposition: A | Discharge status: Home / Self Care | Payer: HRSA Program | Source: Ambulatory Visit | Attending: Internal Medicine | Admitting: Internal Medicine

## 2020-04-09 ENCOUNTER — Encounter: Payer: Self-pay | Admitting: Internal Medicine

## 2020-04-09 ENCOUNTER — Emergency Department (HOSPITAL_COMMUNITY): Admission: EM | Admit: 2020-04-09 | Discharge: 2020-04-09 | Discharge status: Absent without leave | Payer: Self-pay

## 2020-04-09 ENCOUNTER — Ambulatory Visit: Payer: Self-pay | Attending: Internal Medicine | Admitting: Internal Medicine

## 2020-04-09 VITALS — BP 119/78 | HR 100 | Temp 99.1°F | Resp 16 | Ht 64.0 in | Wt 114.4 lb

## 2020-04-09 DIAGNOSIS — J9601 Acute respiratory failure with hypoxia: Secondary | ICD-10-CM

## 2020-04-09 DIAGNOSIS — R0609 Other forms of dyspnea: Secondary | ICD-10-CM

## 2020-04-09 DIAGNOSIS — U071 COVID-19: Secondary | ICD-10-CM

## 2020-04-09 DIAGNOSIS — J1282 Pneumonia due to coronavirus disease 2019: Secondary | ICD-10-CM | POA: Diagnosis present

## 2020-04-09 DIAGNOSIS — R739 Hyperglycemia, unspecified: Secondary | ICD-10-CM

## 2020-04-09 DIAGNOSIS — Z09 Encounter for follow-up examination after completed treatment for conditions other than malignant neoplasm: Secondary | ICD-10-CM

## 2020-04-09 NOTE — Progress Notes (Signed)
Patient ID: Andrew Allen, male    DOB: 03-02-1986  MRN: [id number]  CC: Hospitalization Follow-up   Subjective: Andrew Allen is a 34 y.o. male who presents for new pt visit and hosp f/u His concerns today include:   Patient with no previous PCP in the past 5 years.  Patient hospitalized 7/31-8/04/2020 with fever and respiratory symptoms.  He tested positive for Covid and was diagnosed with Covid pneumonia.  Chest x-ray ray revealed multifocal pneumonia.  He had severe hypoxia requiring 15 L high flow nasal cannula.  Treated with steroids, remdesivir and Actemra.  At the time of discharge he was requiring 2 L nasal cannula and was discharged home on that.    Today:   Since dischg he is doing better but still SOB. SOB when he is up walking around.   Still using O2 but uses it only after ambulating because that's when he feels out of breath.  Dry cough when he feels SOB but cough is better lately.  No fever since hosp dischg.  He has albuterol inhaler which was prescribed for him at discharge.  He keeps it in his pocket but does not use it often. 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  He has not returned to work as yet.  Not able to stand for a long period of time because he gets SOB. He lives with wife and children but children were in [place] at the time he was hosp. Wife tested positive also.    Past medical, social, family history reviewed. Patient Active Problem List   Diagnosis Date Noted  . Hyperkalemia 03/05/2020  . Hyponatremia 03/05/2020  . Elevated LFTs 03/05/2020  . Pneumonia due to COVID-19 virus 03/01/2020  . Acute respiratory failure due to COVID-19 [facility]) 02/28/2020     Current Outpatient Medications on File Prior to Visit  Medication Sig Dispense Refill  . albuterol (VENTOLIN HFA) 108 (90 Base) MCG/ACT inhaler Inhale 2 puffs into the lungs every 2 (two) hours as needed for wheezing or shortness of breath. (Patient not taking: Reported on 04/09/2020)  6.7 g 0  . benzonatate (TESSALON) 200 MG capsule Take 1 capsule (200 mg total) by mouth 3 (three) times daily. (Patient not taking: Reported on 04/09/2020) 20 capsule 0  . chlorpheniramine-HYDROcodone (TUSSIONEX) 10-8 MG/5ML SUER Take 5 mLs by mouth every 12 (twelve) hours as needed for cough. (Patient not taking: Reported on 04/09/2020) 70 mL 0  . predniSONE (DELTASONE) 10 MG tablet Take 40 mg daily for 1 day, 30 mg daily for 1 day, 20 mg daily for 1 days,10 mg daily for 1 day, then stop (Patient not taking: Reported on 04/09/2020) 10 tablet 0   No current facility-administered medications on file prior to visit.    No Known Allergies  Social History   Socioeconomic History  . Marital status: Married    Spouse name: Not on file  . Number of children: Not on file  . Years of education: Not on file  . Highest education level: Not on file  Occupational History  . Not on file  Tobacco Use  . Smoking status: Never Smoker  . Smokeless tobacco: Never Used  Substance and Sexual Activity  . Alcohol use: No  . Drug use: No  . Sexual activity: Not on file  Other Topics Concern  . Not on file  Social History Narrative  . Not on file   Social Determinants of Health   Financial Resource Strain:   .  Difficulty of Paying Living Expenses: Not on file  Food Insecurity:   . Worried About Programme researcher, broadcasting/film/video in the Last Year: Not on file  . Ran Out of Food in the Last Year: Not on file  Transportation Needs:   . Lack of Transportation (Medical): Not on file  . Lack of Transportation (Non-Medical): Not on file  Physical Activity:   . Days of Exercise per Week: Not on file  . Minutes of Exercise per Session: Not on file  Stress:   . Feeling of Stress : Not on file  Social Connections:   . Frequency of Communication with Friends and Family: Not on file  . Frequency of Social Gatherings with Friends and Family: Not on file  . Attends Religious Services: Not on file  . Active Member of Clubs  or Organizations: Not on file  . Attends Banker Meetings: Not on file  . Marital Status: Not on file  Intimate Partner Violence:   . Fear of Current or Ex-Partner: Not on file  . Emotionally Abused: Not on file  . Physically Abused: Not on file  . Sexually Abused: Not on file    Family History  Family history unknown: Yes    No past surgical history on file.  ROS: Review of Systems Negative except as stated above  PHYSICAL EXAM: BP 119/78   Pulse 100   Temp 99.1 F (37.3 C)   Resp 16   Ht 5\' 4"  (1.626 m)   Wt 114 lb 6.4 oz (51.9 kg)   SpO2 92%   BMI 19.64 kg/m   Physical Exam Pulse ox room air: 93% Pulse ox ambulation room air: 85 to 88%. Pulse ox on 2 L at rest 96% Pulse ox on 2 L with ambulation 91% Patient appears dyspneic with ambulation  General appearance - alert, well appearing, and in no distress Mental status - normal mood, behavior, speech, dress, motor activity, and thought processes Eyes - pupils equal and reactive, extraocular eye movements intact Neck - supple, no significant adenopathy Chest - clear to auscultation, no wheezes, rales or rhonchi, symmetric air entry Heart - normal rate, regular rhythm, normal S1, S2, no murmurs, rubs, clicks or gallops Extremities - peripheral pulses normal, no pedal edema, no clubbing or cyanosis  CMP Latest Ref Rng & Units 03/08/2020 03/07/2020 03/06/2020  Glucose 70 - 99 mg/dL 05/06/2020) 588(F) 027(X)  BUN 6 - 20 mg/dL 17 16 17   Creatinine 0.61 - 1.24 mg/dL 412(I 7.86  Sodium 135 - 145 mmol/L 134(L) 133(L) 135  Potassium 3.5 - 5.1 mmol/L 4.0 4.0 3.9  Chloride 98 - 111 mmol/L 98 97(L) 97(L)  CO2 22 - 32 mmol/L 27 27 29   Calcium 8.9 - 10.3 mg/dL 7.67) 2.09) )  Total Protein 6.5 - 8.1 g/dL 4.7(S) 5.9(L) 6.0(L)  Total Bilirubin 0.3 - 1.2 mg/dL 0.6 1.0 0.9  Alkaline Phos 38 - 126 U/L 74 58 55  AST 15 - 41 U/L 18 19 18   ALT 0 - 44 U/L 36 37 42   Lipid Panel     Component Value Date/Time   TRIG  52 02/28/2020 0142    CBC    Component Value Date/Time   WBC 13.8 (H) 03/09/2020 0500   RBC 5.04 03/09/2020 0500   HGB 14.9 03/09/2020 0500   HCT 45.1 03/09/2020 0500   PLT 267 03/09/2020 0500   MCV 89.5 03/09/2020 0500   MCH 29.6 03/09/2020 0500   MCHC 33.0 03/09/2020 0500  RDW 13.7 03/09/2020 0500   LYMPHSABS 0.7 02/28/2020 0142   MONOABS 0.5 02/28/2020 0142   EOSABS 0.0 02/28/2020 0142   BASOSABS 0.0 02/28/2020 0142    ASSESSMENT AND PLAN: 1. Hospital discharge follow-up 2. Pneumonia due to COVID-19 virus Patient still with significant dyspnea on exertion 1 month out from severe COVID-19 pneumonia.  He is still requiring oxygen and is not able to return to work at this time.  I will get chest x-ray today to see whether the changes on chest x-ray have resolved.  If the chest x-ray looks better, my next step will be to get a CTA of the chest to rule out PE. -Advised patient to use the O2 with ambulation.  Also advised to use the albuterol inhaler prior to ambulation.  We will get him in with Dr. Delford Field for further evaluation. .- DG Chest 2 View; Future - CBC With Differential - Basic metabolic panel  3. Acute hypoxemic respiratory failure (HCC) See plan above. - DG Chest 2 View; Future    Patient was given the opportunity to ask questions.  Patient verbalized understanding of the plan and was able to repeat key elements of the plan.  [employer] Language [phone]) used during this encounter.  Orders Placed This Encounter  Procedures  . DG Chest 2 View  . CBC With Differential  . Basic metabolic panel     Requested Prescriptions    No prescriptions requested or ordered in this encounter    Return in about 6 weeks (around 05/21/2020) for 1 week or sooner with Dr. Delford Field.  Jonah Blue, MD, FACP

## 2020-04-09 NOTE — Patient Instructions (Signed)
You should use your oxygen every time you get up to walk.  Please go to the radiology department at [facility] to have the x-ray done as ordered.  If the x-ray is normal or has shown improvement, the next step would be for [place] to get a CAT scan of your chest..  We will have you follow-up in 1 to 2 weeks with our pulmonologist at this office named Dr. Delford Field.

## 2020-04-10 LAB — CBC WITH DIFFERENTIAL
Basophils Absolute: 0.1 10*3/uL (ref 0.0–0.2)
Basos: 1 %
EOS (ABSOLUTE): 0 10*3/uL (ref 0.0–0.4)
Eos: 1 %
Hematocrit: 44.5 % (ref 37.5–51.0)
Hemoglobin: 15 g/dL (ref 13.0–17.7)
Immature Grans (Abs): 0.1 10*3/uL (ref 0.0–0.1)
Immature Granulocytes: 1 %
Lymphocytes Absolute: 2.6 10*3/uL (ref 0.7–3.1)
Lymphs: 29 %
MCH: 30.3 pg (ref 26.6–33.0)
MCHC: 33.7 g/dL (ref 31.5–35.7)
MCV: 90 fL (ref 79–97)
Monocytes Absolute: 0.6 10*3/uL (ref 0.1–0.9)
Monocytes: 6 %
Neutrophils Absolute: 5.6 10*3/uL (ref 1.4–7.0)
Neutrophils: 62 %
RBC: 4.95 x10E6/uL (ref 4.14–5.80)
RDW: 14.1 % (ref 11.6–15.4)
WBC: 8.9 10*3/uL (ref 3.4–10.8)

## 2020-04-10 LAB — BASIC METABOLIC PANEL
BUN/Creatinine Ratio: 16 (ref 9–20)
BUN: 11 mg/dL (ref 6–20)
CO2: 23 mmol/L (ref 20–29)
Calcium: 9.6 mg/dL (ref 8.7–10.2)
Chloride: 102 mmol/L (ref 96–106)
Creatinine, Ser: 0.7 mg/dL — ABNORMAL LOW (ref 0.76–1.27)
GFR calc Af Amer: 140 mL/min/{1.73_m2} (ref >59)
GFR calc non Af Amer: 121 mL/min/{1.73_m2} (ref >59)
Glucose: 183 mg/dL — ABNORMAL HIGH (ref 65–99)
Potassium: 4.1 mmol/L (ref 3.5–5.2)
Sodium: 136 mmol/L (ref 134–144)

## 2020-04-10 NOTE — Addendum Note (Signed)
Addended by: Jonah Blue B on: 04/10/2020 04:04 PM   Modules accepted: Orders

## 2020-04-10 NOTE — Progress Notes (Signed)
Let patient know that his blood cell count is normal.  Kidney function normal.  Blood sugar level elevated.  We will do additional testing to screen for diabetes.  Chest x-ray showed scarring in the lungs likely from previous Covid pneumonia.  I would like to do a CAT scan of the lungs to screen for blood clots.

## 2020-04-17 LAB — HEMOGLOBIN A1C
Est. average glucose Bld gHb Est-mCnc: 146 mg/dL
Hgb A1c MFr Bld: 6.7 % — ABNORMAL HIGH (ref 4.8–5.6)

## 2020-04-17 LAB — SPECIMEN STATUS REPORT

## 2020-04-17 NOTE — Progress Notes (Signed)
Let patient know that blood test reveals that he has diabetes.  He has an upcoming appointment with Dr. Delford Field his lungs issue.  He should remember to mention this to him so that he can get him started on treatment.  Also please schedule him for the CTA of the chest for early next week.  The plan is to have this done before he sees Dr. Delford Field.

## 2020-04-19 ENCOUNTER — Telehealth: Payer: Self-pay

## 2020-04-19 NOTE — Telephone Encounter (Signed)
Contacted pt to go over lab results and CT appointment Andrew Allen translated.   CT is schedule for 21, 2021 at 330pm at [facility]. Pt is to arrive at 315pm. Pt is to have liquids only 4 hours prior.  Pt is aware of results and appt and doens't have any questions or concerns

## 2020-04-20 ENCOUNTER — Ambulatory Visit (HOSPITAL_COMMUNITY)
Admission: RE | Admit: 2020-04-20 | Discharge: 2020-04-20 | Disposition: A | Discharge status: Home / Self Care | Payer: Self-pay | Source: Ambulatory Visit | Attending: Internal Medicine | Admitting: Internal Medicine

## 2020-04-20 ENCOUNTER — Other Ambulatory Visit: Payer: Self-pay | Admitting: Internal Medicine

## 2020-04-20 ENCOUNTER — Other Ambulatory Visit: Payer: Self-pay

## 2020-04-20 DIAGNOSIS — R0600 Dyspnea, unspecified: Secondary | ICD-10-CM | POA: Insufficient documentation

## 2020-04-20 DIAGNOSIS — J9601 Acute respiratory failure with hypoxia: Secondary | ICD-10-CM | POA: Insufficient documentation

## 2020-04-20 DIAGNOSIS — R0609 Other forms of dyspnea: Secondary | ICD-10-CM

## 2020-04-20 MED ORDER — MOXIFLOXACIN HCL 400 MG PO TABS: 400.0000 mg | ORAL_TABLET | Freq: Every day | ORAL | 0 refills | Status: DC

## 2020-04-20 MED ORDER — PREDNISONE 20 MG PO TABS: 20.0000 mg | ORAL_TABLET | Freq: Every day | ORAL | 0 refills | Status: DC

## 2020-04-20 MED ORDER — IOHEXOL 350 MG/ML SOLN
75.0000 mL | Freq: Once | INTRAVENOUS | Status: AC | PRN
  Administered 2020-04-20: 75 mL via INTRAVENOUS

## 2020-04-21 MED FILL — predniSONE 20 MG TABS: 20 | 5 days supply | Qty: 5 | Fill #0

## 2020-04-21 MED FILL — MOXIFLOXACIN HCL 400 MG TAB: 400 | 7 days supply | Qty: 7 | Fill #0

## 2020-04-26 ENCOUNTER — Encounter: Payer: Self-pay | Admitting: Critical Care Medicine

## 2020-04-26 ENCOUNTER — Ambulatory Visit: Payer: HRSA Program | Attending: Critical Care Medicine | Admitting: Critical Care Medicine

## 2020-04-26 ENCOUNTER — Other Ambulatory Visit: Payer: Self-pay

## 2020-04-26 VITALS — BP 124/81 | HR 88 | Temp 98.2°F | Wt 177.6 lb

## 2020-04-26 DIAGNOSIS — R7989 Other specified abnormal findings of blood chemistry: Secondary | ICD-10-CM

## 2020-04-26 DIAGNOSIS — J1282 Pneumonia due to coronavirus disease 2019: Secondary | ICD-10-CM

## 2020-04-26 DIAGNOSIS — E1165 Type 2 diabetes mellitus with hyperglycemia: Secondary | ICD-10-CM | POA: Diagnosis not present

## 2020-04-26 DIAGNOSIS — U071 COVID-19: Secondary | ICD-10-CM | POA: Diagnosis not present

## 2020-04-26 DIAGNOSIS — J9600 Acute respiratory failure, unspecified whether with hypoxia or hypercapnia: Secondary | ICD-10-CM

## 2020-04-26 DIAGNOSIS — E669 Obesity, unspecified: Secondary | ICD-10-CM | POA: Insufficient documentation

## 2020-04-26 DIAGNOSIS — Z1159 Encounter for screening for other viral diseases: Secondary | ICD-10-CM

## 2020-04-26 DIAGNOSIS — E1169 Type 2 diabetes mellitus with other specified complication: Secondary | ICD-10-CM | POA: Insufficient documentation

## 2020-04-26 MED ORDER — TRUEPLUS LANCETS 28G MISC: 1 refills | Status: AC

## 2020-04-26 MED ORDER — TRUE METRIX METER W/DEVICE KIT: PACK | 0 refills | Status: AC

## 2020-04-26 MED ORDER — PREDNISONE 20 MG PO TABS: ORAL_TABLET | ORAL | 0 refills | Status: DC

## 2020-04-26 MED ORDER — METFORMIN HCL 500 MG PO TABS: 500.0000 mg | ORAL_TABLET | Freq: Two times a day (BID) | ORAL | 3 refills | Status: DC

## 2020-04-26 MED ORDER — TRUE METRIX BLOOD GLUCOSE TEST VI STRP: ORAL_STRIP | 12 refills | Status: AC

## 2020-04-26 MED FILL — predniSONE 20 MG TABS: 20 | 10 days supply | Qty: 20 | Fill #0

## 2020-04-26 MED FILL — METFORMIN HCL 500 MG TABS: 500 | 30 days supply | Qty: 60 | Fill #0

## 2020-04-26 MED FILL — TRUE METRIX TEST STRIP: 50 days supply | Qty: 100 | Fill #0

## 2020-04-26 MED FILL — TRUEplus LANCETS 28G MISC: 50 days supply | Qty: 100 | Fill #0

## 2020-04-26 MED FILL — !TRUE METRIX BLOOD GLUCOSE: 1 days supply | Qty: 1 | Fill #0

## 2020-04-26 NOTE — Progress Notes (Signed)
Subjective:    Patient ID: Andrew Allen, male    DOB: 1986-06-13, 34 y.o.   MRN: [id number]  04/26/2020 34 y.o.M Latino here for post covid pna f/u and eval per pcp Dr Wynetta Emery This patient was hospitalized between the end of July and 10 August with severe Covid pneumonia and acute respiratory failure.  He required high flow oxygen was in the intensive care unit.  He was retreated with steroids remdesivir and Actemra.  Patient slowly improved and was discharged on 2 L nasal cannula.  Since discharge the patient was still short of breath still having difficulty with coming off oxygen he used to work in [employer] as a Training and development officer and has not been able to return to work as of yet.  He cannot stand for long periods of time because of dyspnea.  The patient was then given Avelox and prednisone by his primary and comes in today for pulmonary assessment.  Note also had elevated CBGs and an A1c of 6.7.  The patient states overall his breathing is improved.  He is less short of breath with exertion.  He does have some low back pain some occasional abdominal pain and loose stools.  He is not on any medicines for his blood sugar.  His cough is dry.  See shortness of breath assessment below  CT chest did not show pulmonary emboli but showed bilateral groundglass infiltrates  Shortness of Breath This is a new problem. The current episode started 1 to 4 weeks ago. The problem occurs daily. The problem has been rapidly improving. Pertinent negatives include no chest pain, fever, headaches, orthopnea, PND, rhinorrhea, sore throat, sputum production, swollen glands, vomiting or wheezing. The symptoms are aggravated by any activity and exercise. Associated symptoms comments: DRY COUGH Back pain, minimal. The patient has no known risk factors for DVT/PE. He has tried beta agonist inhalers for the symptoms. The treatment provided moderate relief. His past medical history is significant for pneumonia.   History  reviewed. No pertinent past medical history.   Family History  Family history unknown: Yes     Social History   Socioeconomic History  . Marital status: Married    Spouse name: Not on file  . Number of children: Not on file  . Years of education: Not on file  . Highest education level: Not on file  Occupational History  . Not on file  Tobacco Use  . Smoking status: Never Smoker  . Smokeless tobacco: Never Used  Substance and Sexual Activity  . Alcohol use: No  . Drug use: No  . Sexual activity: Not on file  Other Topics Concern  . Not on file  Social History Narrative  . Not on file   Social Determinants of Health   Financial Resource Strain:   . Difficulty of Paying Living Expenses: Not on file  Food Insecurity:   . Worried About Charity fundraiser in the Last Year: Not on file  . Ran Out of Food in the Last Year: Not on file  Transportation Needs:   . Lack of Transportation (Medical): Not on file  . Lack of Transportation (Non-Medical): Not on file  Physical Activity:   . Days of Exercise per Week: Not on file  . Minutes of Exercise per Session: Not on file  Stress:   . Feeling of Stress : Not on file  Social Connections:   . Frequency of Communication with Friends and Family: Not on file  . Frequency of Social  Gatherings with Friends and Family: Not on file  . Attends Religious Services: Not on file  . Active Member of Clubs or Organizations: Not on file  . Attends Archivist Meetings: Not on file  . Marital Status: Not on file  Intimate Partner Violence:   . Fear of Current or Ex-Partner: Not on file  . Emotionally Abused: Not on file  . Physically Abused: Not on file  . Sexually Abused: Not on file     No Known Allergies   Outpatient Medications Prior to Visit  Medication Sig Dispense Refill  . albuterol (VENTOLIN HFA) 108 (90 Base) MCG/ACT inhaler Inhale 2 puffs into the lungs every 2 (two) hours as needed for wheezing or shortness of  breath. 6.7 g 0  . moxifloxacin (AVELOX) 400 MG tablet Take 1 tablet (400 mg total) by mouth daily. (Patient not taking: Reported on 04/26/2020) 7 tablet 0  . predniSONE (DELTASONE) 20 MG tablet Take 1 tablet (20 mg total) by mouth daily with breakfast. (Patient not taking: Reported on 04/26/2020) 5 tablet 0   No facility-administered medications prior to visit.     Review of Systems  Constitutional: Negative for fever.  HENT: Negative for rhinorrhea and sore throat.   Respiratory: Positive for shortness of breath. Negative for sputum production and wheezing.   Cardiovascular: Negative for chest pain, orthopnea and PND.  Gastrointestinal: Negative for vomiting.  Neurological: Negative for headaches.       Objective:   Physical Exam Vitals:   04/26/20 1137  BP: 124/81  Pulse: 88  Temp: 98.2 F (36.8 C)  Weight: 177 lb 9.6 oz (80.6 kg)    Gen: Pleasant, well-nourished, in no distress,  normal affect  ENT: No lesions,  mouth clear,  oropharynx clear, no postnasal drip  Neck: No JVD, no TMG, no carotid bruits  Lungs: No use of accessory muscles, no dullness to percussion, clear without rales or rhonchi  Cardiovascular: RRR, heart sounds normal, no murmur or gallops, no peripheral edema  Abdomen: soft and NT, no HSM,  BS normal  Musculoskeletal: No deformities, no cyanosis or clubbing  Neuro: alert, non focal  Skin: Warm, no lesions or rashes  Ambulatory pulse ox reveals no evidence of desaturation with exertion       Assessment & Plan:  I personally reviewed all images and lab data in the [facility] system as well as any outside material available during this office visit and agree with the  radiology impressions.   Acute respiratory failure due to COVID-19 [facility]) Acute hypoxic respiratory failure now resolved  Discontinue oxygen  Pneumonia due to COVID-19 virus Covid pneumonia with hypoxemia now improved however residual infiltrates on CT scan  Plan is to give an  additional pulse of prednisone for 10 more days and then discontinue  Discontinue oxygen from the home  Use albuterol as needed  Patient may return to work any time and is now out of isolation  Patient advised to receive a Covid vaccine series starting in 1 month  We will hold off on flu shot for now   Elevated LFTs We will follow-up liver function panel  Type 2 diabetes mellitus with hyperglycemia, without long-term current use of insulin ([place]) Type 2 diabetes with hyperglycemia we will begin Metformin 500 mg twice daily and obtain for the patient glucose meter patient was given a diabetic diet   Andrew Allen was seen today for follow-up.  Diagnoses and all orders for this visit:  Pneumonia due to COVID-19 virus -  Comprehensive metabolic panel -     CBC with Differential/Platelet -     Care order/instruction  Elevated LFTs -     Comprehensive metabolic panel -     CBC with Differential/Platelet  Type 2 diabetes mellitus with hyperglycemia, without long-term current use of insulin (HCC) -     Microalbumin / creatinine urine ratio  Need for hepatitis C screening test -     HCV Ab w/Rflx to Verification  Acute respiratory failure due to COVID-19 [facility]) -     Care order/instruction  Other orders -     predniSONE (DELTASONE) 20 MG tablet; Take 2 tablets daily for 10 days then stop -     metFORMIN (GLUCOPHAGE) 500 MG tablet; Take 1 tablet (500 mg total) by mouth 2 (two) times daily with a meal. -     Blood Glucose Monitoring Suppl (TRUE METRIX METER) w/Device KIT; Use to measure blood sugar twice a day -     TRUEplus Lancets 28G MISC; Use to measure blood sugar twice a day -     glucose blood (TRUE METRIX BLOOD GLUCOSE TEST) test strip; Use as instructed

## 2020-04-26 NOTE — Patient Instructions (Signed)
Begin Metformin 1 pill twice daily  Measure your blood sugar daily  Follow healthy diet as below  Resume prednisone take 2 tablets daily for 10 days till gone  You do not require oxygen at this time  You can return to work at any time  Return to see Dr. Delford Field again in 1 month  Labs today include metabolic panel blood count hepatitis C level urine for microalbumin   Diabetes mellitus y nutricin, en adultos Diabetes Mellitus and Nutrition, Adult Si sufre de diabetes (diabetes mellitus), es muy importante tener hbitos alimenticios saludables debido a que sus niveles de Psychologist, counselling sangre (glucosa) se ven afectados en gran medida por lo que come y bebe. [place] alimentos saludables en las cantidades [place], aproximadamente a la [employer], [place] ayudar a:  Scientist, physiological glucemia.  Disminuir el riesgo de sufrir una enfermedad cardaca.  Mejorar la presin arterial.  Barista o mantener un peso saludable. Todas las personas que sufren de diabetes son diferentes y cada una tiene necesidades diferentes en cuanto a un plan de alimentacin. El mdico puede recomendarle que trabaje con un especialista en dietas y nutricin (nutricionista) para Tax adviser plan para usted. Su plan de alimentacin puede variar segn factores como:  Las caloras que necesita.  Los medicamentos que toma.  Su peso.  Sus niveles de glucemia, presin arterial y colesterol.  Su nivel de [place].  Otras afecciones que tenga, como enfermedades cardacas o renales. Cmo me afectan los carbohidratos? Los carbohidratos, o hidratos de carbono, afectan su nivel de glucemia ms que cualquier otro tipo de alimento. La ingesta de carbohidratos naturalmente aumenta la cantidad de [employer]. El recuento de carbohidratos es un mtodo destinado a Midwife un registro de la cantidad de carbohidratos que se consumen. El recuento de carbohidratos es importante para Pharmacologist la glucemia a un  nivel saludable, especialmente si utiliza insulina o toma determinados medicamentos por va oral para la diabetes. Es importante conocer la cantidad de carbohidratos que se pueden ingerir en cada comida sin correr Surveyor, minerals. Esto es Government social research officer. Su nutricionista puede ayudarlo a calcular la cantidad de carbohidratos que debe ingerir en cada comida y en cada refrigerio. Entre los alimentos que contienen carbohidratos, se incluyen:  Pan, cereal, arroz, pastas y galletas.  Papas y maz.  Guisantes, frijoles y lentejas.  Leche y Dentist.  Nils Pyle y [place].  Postres, como pasteles, galletas, helado y caramelos. Cmo me afecta el alcohol? El alcohol puede provocar disminuciones sbitas de la glucemia (hipoglucemia), especialmente si utiliza insulina o toma determinados medicamentos por va oral para la diabetes. La hipoglucemia es una afeccin potencialmente mortal. Los sntomas de la hipoglucemia (somnolencia, mareos y confusin) son similares a los sntomas de haber consumido demasiado alcohol. Si el mdico afirma que el alcohol es seguro para usted, [place] estas pautas:  Limite el consumo de alcohol a no ms de por da si es mujer y no est [place], y a si es hombre. Una medida equivale a 12oz ( ) de cerveza, 5oz ( ) de vino o 1oz (7ml) de bebidas alcohlicas de alta graduacin.  No beba con el estmago vaco.  Mantngase hidratado bebiendo agua, refrescos dietticos o t helado sin azcar.  Tenga en cuenta que los refrescos comunes, los jugos y otras bebida para Engineer, manufacturing pueden contener mucha azcar y se deben contar como carbohidratos. Cules son algunos consejos para seguir este plan?  Leer las etiquetas de los alimentos  Comience por  leer el tamao de la porcin en la "Informacin nutricional" en las etiquetas de los alimentos envasados y las bebidas. La cantidad de caloras, carbohidratos, grasas y otros nutrientes mencionados en la  etiqueta se basan en una porcin del alimento. Muchos alimentos contienen ms de una porcin por envase.  Verifique la cantidad total de gramos (g) de carbohidratos totales en una porcin. Puede calcular la cantidad de porciones de carbohidratos al dividir el total de carbohidratos por 15. Por ejemplo, si un alimento tiene un total de 30g de carbohidratos, equivale a 2 porciones de carbohidratos.  Verifique la cantidad de gramos (g) de grasas saturadas y grasas trans en una porcin. Escoja alimentos que no contengan grasa o que tengan un bajo contenido.  Verifique la cantidad de miligramos (mg) de sal (sodio) en una porcin. La mayora de las personas deben limitar la ingesta de sodio total a menos de 2300mg  por .  Siempre consulte la informacin nutricional de los alimentos etiquetados como "con bajo contenido de grasa" o "sin grasa". Estos alimentos pueden tener un mayor contenido de Futures trader agregada o carbohidratos refinados, y deben evitarse.  Hable con su nutricionista para identificar sus objetivos diarios en cuanto a los nutrientes mencionados en la etiqueta. Al ir de compras  Evite comprar alimentos procesados, enlatados o precocinados. Estos alimentos tienden a International aid/development worker mayor cantidad de [place], sodio y azcar agregada.  Compre en la zona exterior de la tienda de comestibles. Esta zona incluye frutas y verduras frescas, granos a granel, carnes frescas y productos lcteos frescos. Al cocinar  Utilice mtodos de coccin a baja temperatura, como hornear, en lugar de mtodos de coccin a alta temperatura, como frer en abundante aceite.  Cocine con aceites saludables, como el aceite de [place], canola o [place].  Evite cocinar con manteca, crema o carnes con alto contenido de grasa. Planificacin de las comidas  Coma las comidas y los refrigerios regularmente, preferentemente a la misma hora todos [place]. Evite pasar largos perodos de tiempo sin comer.  Consuma alimentos ricos en  fibra, como frutas frescas, verduras, frijoles y cereales integrales. Consulte a su nutricionista sobre cuntas porciones de carbohidratos puede consumir en cada comida.  Consuma entre 4 y 6 onzas (oz) de protenas magras por da, como carnes [place], pollo, pescado, huevos o tofu. Una onza de protena magra equivale a: ? 1 onza de carne, pollo o pescado. ? 1huevo. ?  taza de tofu.  Coma algunos alimentos por da que contengan grasas saludables, como aguacates, frutos secos, semillas y pescado. Estilo de vida  Controle su nivel de glucemia con regularidad.  Haga actividad fsica habitualmente como se lo haya indicado el mdico. Esto puede incluir lo siguiente: ? [place] semanales de ejercicio de intensidad moderada o alta. Esto podra incluir caminatas dinmicas, ciclismo o gimnasia acutica. ? Realizar ejercicios de elongacin y de fortalecimiento, como yoga o levantamiento de pesas, por lo menos 2veces por semana.  Tome los se lo haya indicado el mdico.  No consuma ningn producto que contenga nicotina o tabaco, como cigarrillos y [employer]. Si necesita ayuda para dejar de fumar, consulte al Administrator, Civil Service con un asesor o instructor en diabetes para identificar estrategias para controlar el estrs y cualquier desafo emocional y social. Preguntas para hacerle al mdico  Es necesario que consulte a CIGNA en el cuidado de la diabetes?  Es necesario que me rena con un nutricionista?  A qu nmero puedo llamar si tengo preguntas?  Cules son los mejores  momentos para controlar la glucemia? Dnde encontrar ms informacin:  Asociacin Estadounidense de la Diabetes (American Diabetes Association): diabetes.org  Academia de Nutricin y Pension scheme manager (Academy of Nutrition and Dietetics): www.eatright.org  [employer] de la Diabetes y las Enfermedades Digestivas y [facility] of Diabetes and Digestive and Kidney  Diseases, NIH): CarFlippers.tn Resumen  Un plan de alimentacin saludable lo ayudar a Scientist, physiological glucemia y Pharmacologist un estilo de vida saludable.  Trabajar con un especialista en dietas y nutricin (nutricionista) puede ayudarlo a Designer, television/film set de alimentacin para usted.  Tenga en cuenta que los carbohidratos (hidratos de carbono) y el alcohol tienen efectos inmediatos en sus niveles de glucemia. Es importante contar los carbohidratos que ingiere y consumir alcohol con prudencia. Esta informacin no tiene Theme park manager el consejo del mdico. Asegrese de hacerle al mdico cualquier pregunta que tenga. Document Revised: 03/27/2017 Document Reviewed: 11/06/2016 Elsevier Patient Education  2020 [employer].

## 2020-04-26 NOTE — Assessment & Plan Note (Signed)
Covid pneumonia with hypoxemia now improved however residual infiltrates on CT scan  Plan is to give an additional pulse of prednisone for 10 more days and then discontinue  Discontinue oxygen from the home  Use albuterol as needed  Patient may return to work any time and is now out of isolation  Patient advised to receive a Covid vaccine series starting in 1 month  We will hold off on flu shot for now

## 2020-04-26 NOTE — Assessment & Plan Note (Signed)
Acute hypoxic respiratory failure now resolved  Discontinue oxygen

## 2020-04-26 NOTE — Assessment & Plan Note (Signed)
We will follow-up liver function panel

## 2020-04-26 NOTE — Assessment & Plan Note (Signed)
Type 2 diabetes with hyperglycemia we will begin Metformin 500 mg twice daily and obtain for the patient glucose meter patient was given a diabetic diet

## 2020-04-27 LAB — CBC WITH DIFFERENTIAL/PLATELET
Basophils Absolute: 0.2 10*3/uL (ref 0.0–0.2)
Basos: 1 %
EOS (ABSOLUTE): 0.1 10*3/uL (ref 0.0–0.4)
Eos: 1 %
Hematocrit: 44.1 % (ref 37.5–51.0)
Hemoglobin: 15 g/dL (ref 13.0–17.7)
Immature Grans (Abs): 0.1 10*3/uL (ref 0.0–0.1)
Immature Granulocytes: 1 %
Lymphocytes Absolute: 4.7 10*3/uL — ABNORMAL HIGH (ref 0.7–3.1)
Lymphs: 36 %
MCH: 30.5 pg (ref 26.6–33.0)
MCHC: 34 g/dL (ref 31.5–35.7)
MCV: 90 fL (ref 79–97)
Monocytes Absolute: 1 10*3/uL — ABNORMAL HIGH (ref 0.1–0.9)
Monocytes: 8 %
Neutrophils Absolute: 6.9 10*3/uL (ref 1.4–7.0)
Neutrophils: 53 %
Platelets: 187 10*3/uL (ref 150–450)
RBC: 4.92 x10E6/uL (ref 4.14–5.80)
RDW: 14.4 % (ref 11.6–15.4)
WBC: 13 10*3/uL — ABNORMAL HIGH (ref 3.4–10.8)

## 2020-04-27 LAB — COMPREHENSIVE METABOLIC PANEL
ALT: 28 IU/L (ref 0–44)
AST: 22 IU/L (ref 0–40)
Albumin/Globulin Ratio: 1.5 (ref 1.2–2.2)
Albumin: 4.8 g/dL (ref 4.0–5.0)
Alkaline Phosphatase: 91 IU/L (ref 44–121)
BUN/Creatinine Ratio: 21 — ABNORMAL HIGH (ref 9–20)
BUN: 14 mg/dL (ref 6–20)
Bilirubin Total: 0.4 mg/dL (ref 0.0–1.2)
CO2: 23 mmol/L (ref 20–29)
Calcium: 9.7 mg/dL (ref 8.7–10.2)
Chloride: 102 mmol/L (ref 96–106)
Creatinine, Ser: 0.66 mg/dL — ABNORMAL LOW (ref 0.76–1.27)
GFR calc Af Amer: 144 mL/min/{1.73_m2} (ref >59)
GFR calc non Af Amer: 124 mL/min/{1.73_m2} (ref >59)
Globulin, Total: 3.1 g/dL (ref 1.5–4.5)
Glucose: 81 mg/dL (ref 65–99)
Potassium: 3.5 mmol/L (ref 3.5–5.2)
Sodium: 141 mmol/L (ref 134–144)
Total Protein: 7.9 g/dL (ref 6.0–8.5)

## 2020-04-27 LAB — HCV INTERPRETATION

## 2020-04-27 LAB — MICROALBUMIN / CREATININE URINE RATIO
Creatinine, Urine: 184.6 mg/dL
Microalb/Creat Ratio: 8 mg/g creat (ref 0–29)
Microalbumin, Urine: 15.3 ug/mL

## 2020-04-27 LAB — HCV AB W/RFLX TO VERIFICATION: HCV Ab: <0.1 s/co ratio (ref 0.0–0.9)

## 2020-05-19 ENCOUNTER — Ambulatory Visit: Payer: Self-pay | Attending: Critical Care Medicine | Admitting: Critical Care Medicine

## 2020-05-19 ENCOUNTER — Ambulatory Visit (HOSPITAL_BASED_OUTPATIENT_CLINIC_OR_DEPARTMENT_OTHER): Payer: Self-pay | Admitting: Pharmacist

## 2020-05-19 ENCOUNTER — Encounter: Payer: Self-pay | Admitting: Critical Care Medicine

## 2020-05-19 ENCOUNTER — Ambulatory Visit (HOSPITAL_BASED_OUTPATIENT_CLINIC_OR_DEPARTMENT_OTHER): Payer: Self-pay | Admitting: Licensed Clinical Social Worker

## 2020-05-19 ENCOUNTER — Other Ambulatory Visit: Payer: Self-pay

## 2020-05-19 VITALS — BP 123/77 | HR 71 | Resp 16 | Wt 177.6 lb

## 2020-05-19 DIAGNOSIS — Z1331 Encounter for screening for depression: Secondary | ICD-10-CM

## 2020-05-19 DIAGNOSIS — Z23 Encounter for immunization: Secondary | ICD-10-CM

## 2020-05-19 DIAGNOSIS — R7989 Other specified abnormal findings of blood chemistry: Secondary | ICD-10-CM

## 2020-05-19 DIAGNOSIS — U071 COVID-19: Secondary | ICD-10-CM

## 2020-05-19 DIAGNOSIS — J1282 Pneumonia due to coronavirus disease 2019: Secondary | ICD-10-CM

## 2020-05-19 DIAGNOSIS — E1165 Type 2 diabetes mellitus with hyperglycemia: Secondary | ICD-10-CM

## 2020-05-19 LAB — GLUCOSE, POCT (MANUAL RESULT ENTRY): POC Glucose: 199 mg/dl — AB (ref 70–99)

## 2020-05-19 NOTE — Patient Instructions (Addendum)
A Tdap was given.  A flu shot was given, return in a few weeks for a pneumonia shot  Please obtain your Moderna Covid vaccine in one week at our mobile unit, we will give you the schedule  No medication changes  Your depression score was high and we will connect you with Leavy Cella our Behavioral health social worker for a visit  Follow up with dr Gaetano Net Td (ttanos y difteria): lo que debe saber Td (Tetanus, Diphtheria) Vaccine: What You Need to Know 1. Por qu vacunarse? La vacuna Td puede prevenir el ttanos y la difteria. El ttanos ingresa al organismo a travs de cortes o heridas. La difteria se contagia de persona a Social worker.  El [place] (T) provoca rigidez dolorosa en los msculos. El ttanos puede causar graves problemas de Banks, como no poder abrir la boca, Warehouse manager dificultad para tragar y Industrial/product designer, o la muerte.  La DIFTERIA (D) puede causar dificultad para respirar, insuficiencia cardaca, parlisis o muerte. 2. Madilyn Fireman Td La vacuna Td es solo para nios de 7 aos en adelante, adolescentes y [place].  La vacuna Td habitualmente se aplica como dosis de refuerzo cada 10aos, pero tambin puede administrarse antes si la persona sufre una [place] o herida sucia y grave. En lugar de la vacuna Td, se puede utilizar otra vacuna llamada Tdap que, adems del ttanos y la difteria, protege contra la tos [place], tambin conocida como "tos convulsa".  La vacuna Td puede aplicarse al mismo tiempo que otras vacunas. 3. Hable con el mdico Comunquese con la persona que le coloca las vacunas si la persona que la recibe:  Ha tenido una reaccin alrgica despus de Neomia Dear dosis anterior de cualquier vacuna contra el ttanos o la difteria, o cualquier alergia grave, potencialmente mortal.  Alguna vez tuvo sndrome de Guillain-Barr (tambin llamado SGB).  Ha tenido dolor intenso o hinchazn despus de una dosis anterior de cualquier vacuna contra el ttanos o la difteria. En algunos  casos, es posible que el mdico decida posponer la aplicacin de la vacuna Td para una visita en el futuro.  Las personas que sufren trastornos menores, como un resfro, pueden vacunarse. Las [employer] tienen enfermedades moderadas o graves generalmente deben esperar hasta recuperarse para poder recibir la vacunaTd.  Su mdico puede darle ms informacin. 4. Riesgos de [place] reaccin a la vacuna  Despus de recibir la vacuna Td a veces se puede Surveyor, mining, enrojecimiento o Paramedic donde se aplic la inyeccin, fiebre leve, dolor de cabeza, sensacin de cansancio y nuseas, vmitos, diarrea o dolor de [place]. Las personas a veces se desmayan despus de procedimientos mdicos, incluida la vacunacin. Informe al mdico si se siente mareado, tiene cambios en la visin o zumbidos en los odos.  Al igual que con cualquier Automatic Data, existe una probabilidad muy remota de que una vacuna cause una reaccin alrgica grave, otra lesin grave o la muerte. 5. Qu pasa si se presenta un problema grave? Podra producirse una reaccin alrgica despus de que la persona vacunada abandone la clnica. Si observa signos de Runner, broadcasting/film/video grave (ronchas, hinchazn de la cara y la garganta, dificultad para respirar, latidos cardacos acelerados, mareos o debilidad), llame al 9-1-1 y lleve a la persona al hospital ms cercano.  Si se presentan otros signos que le preocupan, comunquese con su mdico.  Las reacciones adversas deben informarse al Sistema de Informe de Eventos Adversos de Administrator, arts (Vaccine Adverse Event Reporting System, VAERS). Por lo general, el mdico  presenta este informe o puede hacerlo usted mismo. Visite el sitio web del VAERS en www.vaers.LAgents.no o llame al 907-242-4393. El VAERS es solo para Biomedical engineer; su personal no proporciona asesoramiento mdico. 6. Programa Nacional de Compensacin de Daos por [employer] El SunTrust de Compensacin de Daos por Administrator, arts  (National Vaccine Injury [employer], Cabin crew) es un programa federal que fue creado para Patent examiner a las personas que puedan haber sufrido daos al recibir ciertas vacunas. Visite el sitio web del VICP en SpiritualWord.at o llame al 1-709-734-4887 para obtener ms informacin acerca del programa y de cmo presentar un reclamo. Hay un lmite de tiempo para presentar un reclamo de compensacin. 7. Cmo puedo obtener ms informacin?  Pregntele a su mdico.  Comunquese con el servicio de salud de su localidad o su estado.  Comunquese con Building control surveyor for [employer] and Prevention, CDC (Centros para el Control y la Prevencin de [place]): ? Llame al 902-594-4683 (1-800-CDC-INFO) o ? Visite el sitio Environmental manager en [email or web address].uy Declaracin de informacin sobre la vacuna Td (08/03/2018) Esta informacin no tiene Theme park manager el consejo del mdico. Asegrese de hacerle al mdico cualquier pregunta que tenga. Document Revised: 11/26/2018 Document Reviewed: 11/26/2018 Elsevier Patient Education  2020 Elsevier Inc.      Influenza Virus Vaccine injection (Fluarix) Qu es este medicamento? La VACUNA ANTIGRIPAL ayuda a disminuir el riesgo de contraer la influenza, tambin conocida como la gripe. La vacuna solo ayuda a protegerle contra algunas cepas de influenza. Esta vacuna no ayuda a reducir Nurse, adult de contraer influenza pandmica H1N1. Este medicamento puede ser utilizado para otros usos; si tiene alguna pregunta consulte con su proveedor de atencin mdica o con su farmacutico. MARCAS COMUNES: Fluarix, Fluzone Qu le debo informar a mi profesional de la salud antes de tomar este medicamento? Necesita saber si usted presenta alguno de los siguientes problemas o situaciones:  trastorno de sangrado como hemofilia  fiebre o infeccin  sndrome de Guillain-Barre u otros problemas neurolgicos  problemas del sistema  inmunolgico  infeccin por el virus de la inmunodeficiencia humana (VIH) o SIDA  niveles bajos de plaquetas en la sangre  esclerosis mltiple  una reaccin Counselling psychologist o inusual a las vacunas antigripales, a los huevos, protenas de pollo, al ltex, a la gentamicina, a otros medicamentos, alimentos, colorantes o conservantes  si est embarazada o buscando quedar embarazada  si est amamantando a un beb Cmo debo [employer]? Esta vacuna se administra mediante inyeccin por va intramuscular. Lo administra un profesional de [employer]. Recibir una copia de informacin escrita sobre la vacuna antes de cada vacuna. Asegrese de leer este folleto cada vez cuidadosamente. Este folleto puede cambiar con frecuencia. Hable con su pediatra para informarse acerca del uso de este medicamento en nios. Puede requerir atencin especial. Sobredosis: Pngase en contacto inmediatamente con un centro toxicolgico o una sala de urgencia si usted cree que haya tomado demasiado medicamento. ATENCIN: [employer] es solo para usted. No comparta este medicamento con nadie. Qu sucede si me olvido de una dosis? No se aplica en este caso. Qu puede interactuar con este medicamento?  quimioterapia o radioterapia  medicamentos que suprimen el sistema inmunolgico, tales como etanercept, anakinra, infliximab y adalimumab  medicamentos que tratan o previenen cogulos sanguneos, como warfarina  fenitona  medicamentos esteroideos, como la prednisona o la cortisona  teofilina  vacunas Puede ser que esta lista no menciona todas las posibles interacciones. Informe a su profesional de [employer] de [employer]  productos a base de hierbas, medicamentos de venta libre o suplementos nutritivos que est tomando. Si usted fuma, consume bebidas alcohlicas o si utiliza drogas ilegales, indqueselo tambin a su profesional de [employer]. Algunas sustancias pueden interactuar con su medicamento. A qu  debo estar atento al usar PPL Corporation? Informe a su mdico o a Producer, television/film/video de la [employer] todos los efectos secundarios que persistan despus de [address]. Llame a su proveedor de atencin mdica si se presentan sntomas inusuales dentro de las 6 semanas posteriores a la vacunacin. Es posible que todava pueda contraer la gripe, pero la enfermedad no ser tan fuerte como normalmente. No puede contraer la gripe de esta vacuna. La vacuna antigripal no le protege contra resfros u otras enfermedades que pueden causar [place]. Debe vacunarse cada ao. Qu efectos secundarios puedo tener al Boston Scientific este medicamento? Efectos secundarios que debe informar a su mdico o a Producer, television/film/video de la salud tan pronto como sea posible:  Therapist, art como erupcin cutnea, picazn o urticarias, hinchazn de la cara, labios o lengua Efectos secundarios que, por lo general, no requieren atencin mdica (debe informarlos a su mdico o a su profesional de la salud si persisten o si son molestos):  fiebre  dolor de cabeza  molestias y Art gallery manager, sensibilidad, enrojecimiento o Paramedic de la inyeccin  cansancio o debilidad Puede ser que esta lista no menciona todos los posibles efectos secundarios. Comunquese a su mdico por asesoramiento mdico [employer]ard. Usted puede informar los efectos secundarios a la FDA por telfono al 1-800-FDA-1088. Dnde debo guardar mi medicina? Esta vacuna se administra solamente en clnicas, farmacias, consultorio mdico u otro consultorio de un profesional de la salud y no Teacher, early years/pre en su domicilio. ATENCIN: Este folleto es un resumen. Puede ser que no cubra toda la posible informacin. Si usted tiene preguntas acerca de esta medicina, consulte con su mdico, su farmacutico o su profesional de Radiographer, therapeutic.  2020 Elsevier/Gold Standard (2010-01-18 15:31:40)

## 2020-05-19 NOTE — Progress Notes (Signed)
Subjective:    Patient ID: Andrew Allen, male    DOB: Dec 04, 1985, 34 y.o.   MRN: [id number]  04/26/2020 34 y.o.M Latino here for post covid pna f/u and eval per pcp Dr Wynetta Emery This patient was hospitalized between the end of July and 10 August with severe Covid pneumonia and acute respiratory failure.  He required high flow oxygen was in the intensive care unit.  He was retreated with steroids remdesivir and Actemra.  Patient slowly improved and was discharged on 2 L nasal cannula.  Since discharge the patient was still short of breath still having difficulty with coming off oxygen he used to work in [employer] as a Training and development officer and has not been able to return to work as of yet.  He cannot stand for long periods of time because of dyspnea.  The patient was then given Avelox and prednisone by his primary and comes in today for pulmonary assessment.  Note also had elevated CBGs and an A1c of 6.7.  The patient states overall his breathing is improved.  He is less short of breath with exertion.  He does have some low back pain some occasional abdominal pain and loose stools.  He is not on any medicines for his blood sugar.  His cough is dry.  See shortness of breath assessment below  CT chest did not show pulmonary emboli but showed bilateral groundglass infiltrates  05/19/2020 This patient is seen in return follow-up and doing well he took his course of prednisone and is no longer on inhalers. He is off oxygen. He had severe Covid pneumonia and is rapidly improving. Note on arrival blood sugars 199. He has been on changes with his diet and taking his Metformin. He is yet to receive his Covid vaccine series. He is willing to take a flu shot today  Follow-up liver function showed resolution of any liver function abnormalities  Past Medical History:  Diagnosis Date  . Acute respiratory failure due to COVID-19 ([place]) 02/28/2020  . Pneumonia due to COVID-19 virus 03/01/2020     Family History    Family history unknown: Yes       Social History   Socioeconomic History  . Marital status: Married    Spouse name: Not on file  . Number of children: Not on file  . Years of education: Not on file  . Highest education level: Not on file  Occupational History  . Not on file  Tobacco Use  . Smoking status: Never Smoker  . Smokeless tobacco: Never Used  Substance and Sexual Activity  . Alcohol use: No  . Drug use: No  . Sexual activity: Not on file  Other Topics Concern  . Not on file  Social History Narrative  . Not on file   Social Determinants of Health   Financial Resource Strain:   . Difficulty of Paying Living Expenses: Not on file  Food Insecurity:   . Worried About Charity fundraiser in the Last Year: Not on file  . Ran Out of Food in the Last Year: Not on file  Transportation Needs:   . Lack of Transportation (Medical): Not on file  . Lack of Transportation (Non-Medical): Not on file  Physical Activity:   . Days of Exercise per Week: Not on file  . Minutes of Exercise per Session: Not on file  Stress:   . Feeling of Stress : Not on file  Social Connections:   . Frequency of Communication with Friends and  Family: Not on file  . Frequency of Social Gatherings with Friends and Family: Not on file  . Attends Religious Services: Not on file  . Active Member of Clubs or Organizations: Not on file  . Attends Archivist Meetings: Not on file  . Marital Status: Not on file  Intimate Partner Violence:   . Fear of Current or Ex-Partner: Not on file  . Emotionally Abused: Not on file  . Physically Abused: Not on file  . Sexually Abused: Not on file     No Known Allergies   Outpatient Medications Prior to Visit  Medication Sig Dispense Refill  . Blood Glucose Monitoring Suppl (TRUE METRIX METER) w/Device KIT Use to measure blood sugar twice a day 1 kit 0  . glucose blood (TRUE METRIX BLOOD GLUCOSE TEST) test strip Use as instructed 100 each 12  .  metFORMIN (GLUCOPHAGE) 500 MG tablet Take 1 tablet (500 mg total) by mouth 2 (two) times daily with a meal. 60 tablet 3  . TRUEplus Lancets 28G MISC Use to measure blood sugar twice a day 100 each 1  . albuterol (VENTOLIN HFA) 108 (90 Base) MCG/ACT inhaler Inhale 2 puffs into the lungs every 2 (two) hours as needed for wheezing or shortness of breath. 6.7 g 0  . predniSONE (DELTASONE) 20 MG tablet Take 2 tablets daily for 10 days then stop (Patient not taking: Reported on 05/19/2020) 20 tablet 0   No facility-administered medications prior to visit.     Review of Systems  Constitutional: Negative.   HENT: Negative.   Eyes: Negative.   Respiratory: Negative.   Cardiovascular: Negative.   Gastrointestinal: Negative.   Endocrine: Negative.   Genitourinary: Negative.   Musculoskeletal: Negative.   Neurological: Negative.   Hematological: Negative.   Psychiatric/Behavioral: Negative.        Objective:   Physical Exam Vitals:   05/19/20 0916  BP: 123/77  Pulse: 71  Resp: 16  SpO2: 98%  Weight: 177 lb 9.6 oz (80.6 kg)    Gen: Pleasant, well-nourished, in no distress,  normal affect  ENT: No lesions,  mouth clear,  oropharynx clear, no postnasal drip  Neck: No JVD, no TMG, no carotid bruits  Lungs: No use of accessory muscles, no dullness to percussion, clear without rales or rhonchi  Cardiovascular: RRR, heart sounds normal, no murmur or gallops, no peripheral edema  Abdomen: soft and NT, no HSM,  BS normal  Musculoskeletal: No deformities, no cyanosis or clubbing  Neuro: alert, non focal  Skin: Warm, no lesions or rashes  Ambulatory pulse ox reveals no evidence of desaturation with exertion       Assessment & Plan:  I personally reviewed all images and lab data in the [facility] system as well as any outside material available during this office visit and agree with the  radiology impressions.   Pneumonia due to COVID-19 virus Pneumonia secondary to Covid virus now  totally resolved  No further pulmonary follow-up indicated  I counseled the patient to receive the Covid vaccine and he is in agreements to this  Type 2 diabetes mellitus with hyperglycemia, without long-term current use of insulin ([place]) Blood glucose control is improved we will continue Metformin for now  Elevated LFTs Elevated liver function tests have resolved   C[place]by was seen today for diabetes.  Diagnoses and all orders for this visit:  Type 2 diabetes mellitus with hyperglycemia, without long-term current use of insulin ([facility]) -     POCT glucose (manual  entry)  Elevated LFTs  Pneumonia due to COVID-19 virus  Flu shot and tetanus vaccine given at this visit  Patient will return for Pneumovax and will get his Covid vaccine scheduled

## 2020-05-19 NOTE — Assessment & Plan Note (Signed)
Elevated liver function tests have resolved

## 2020-05-19 NOTE — Assessment & Plan Note (Signed)
Pneumonia secondary to Covid virus now totally resolved  No further pulmonary follow-up indicated  I counseled the patient to receive the Covid vaccine and he is in agreements to this

## 2020-05-19 NOTE — Progress Notes (Signed)
Patient presents for vaccination against Influenza and TDAP per orders of Dr. Laural Benes. Consent given. Counseling provided. No contraindications exists. Vaccine administered without incident.   Fabio Neighbors, PharmD PGY2 Ambulatory Care Resident [facility]  Pharmacy

## 2020-05-19 NOTE — Assessment & Plan Note (Signed)
Blood glucose control is improved we will continue Metformin for now

## 2020-05-25 ENCOUNTER — Telehealth: Payer: Self-pay | Admitting: Internal Medicine

## 2020-05-25 NOTE — Telephone Encounter (Signed)
Called patient and left message to return call and schedule an appt with Franky Macho to have his pneumo 23 vaccine.

## 2020-06-03 ENCOUNTER — Other Ambulatory Visit: Payer: Self-pay

## 2020-06-03 ENCOUNTER — Ambulatory Visit: Payer: HRSA Program | Attending: Internal Medicine | Admitting: Family

## 2020-06-03 DIAGNOSIS — Z789 Other specified health status: Secondary | ICD-10-CM

## 2020-06-03 DIAGNOSIS — J1282 Pneumonia due to coronavirus disease 2019: Secondary | ICD-10-CM

## 2020-06-03 DIAGNOSIS — U071 COVID-19: Secondary | ICD-10-CM | POA: Diagnosis not present

## 2020-06-03 DIAGNOSIS — E1165 Type 2 diabetes mellitus with hyperglycemia: Secondary | ICD-10-CM

## 2020-06-03 NOTE — Progress Notes (Signed)
Has no concerns today. °

## 2020-06-03 NOTE — Progress Notes (Signed)
Virtual Visit via Telephone Note  I connected with Andrew Allen, on 06/03/2020 at 3:47 PM by telephone due to the COVID-19 pandemic and verified that I am speaking with the correct person using two identifiers.  Due to current restrictions/limitations of in-office visits due to the COVID-19 pandemic, this scheduled clinical appointment was converted to a telehealth visit.   Consent: I discussed the limitations, risks, security and privacy concerns of performing an evaluation and management service by telephone and the availability of in person appointments. I also discussed with the patient that there may be a patient responsible charge related to this service. The patient expressed understanding and agreed to proceed.  Location of Patient: Home  Location of Provider: [place] [facility] [place]  Persons participating in Telemedicine visit: Willow Chipol Renold Don, NP Carilyn Goodpasture, RN  [employer], Interpreter Name: Tish Frederickson, ID#: [id number]   History of Present Illness: Andrew Allen is a 34 year-old male with history of type 2 diabetes mellitus with hyperglycemia without long-term current use of insulin who presents for 6 week follow-up.  1. PNEUMONIA DUE TO COVID-19 FOLLOW-UP: 05/19/2020: Visit with Dr. Joya Gaskins. Pneumonia secondary to Covid virus totally resolved. No further pulmonary follow-up indicated. Counseled to receive Covid vaccine, patient agreeable.   06/03/2020: Has not received Covid vaccines yet and plans to get them soon. Completed course of Prednisone for pneumonia. Using inhaler occasionally especially if he becomes agitated and the last time being 5 days ago. No longer using oxygen therapy. Returned to work 2 weeks ago where he works at [employer].  Denies cough, shortness of breath, fever, runny nose, nasal congestion, chest pains, and palpitations.   2. DIABETES TYPE 2 FOLLOW-UP: 05/19/2020: Visit with Dr. Joya Gaskins.  Continued on Metformin.   06/03/2020: Last A1C:  6.7% on 04/09/2020 Med Adherence:  [x]  Yes    []  No Medication side effects:  []  Yes    [x]  No Home Monitoring?  [x]  Yes    []  No Home glucose results range: 86-88 morning fasting Diet Adherence: [x]  Yes    []  No Exercise: [x]  Yes, sometimes  Past Medical History:  Diagnosis Date  . Acute respiratory failure due to COVID-19 ([place]) 02/28/2020  . Pneumonia due to COVID-19 virus 03/01/2020   No Known Allergies  Current Outpatient Medications on File Prior to Visit  Medication Sig Dispense Refill  . Blood Glucose Monitoring Suppl (TRUE METRIX METER) w/Device KIT Use to measure blood sugar twice a day 1 kit 0  . glucose blood (TRUE METRIX BLOOD GLUCOSE TEST) test strip Use as instructed 100 each 12  . metFORMIN (GLUCOPHAGE) 500 MG tablet Take 1 tablet (500 mg total) by mouth 2 (two) times daily with a meal. 60 tablet 3  . TRUEplus Lancets 28G MISC Use to measure blood sugar twice a day 100 each 1   No current facility-administered medications on file prior to visit.    Observations/Objective: Alert and oriented x 3. Not in acute distress. Physical examination not completed as this is a telemedicine visit.  Assessment and Plan: 1. Pneumonia due to COVID-19 virus: - Pneumonia secondary to Covid virus totally resolved.  - No further pulmonary follow-up indicated.  - Counseled to receive Covid vaccine, patient agreeable.   2. Type 2 diabetes mellitus with hyperglycemia, without long-term current use of insulin ([place]): - Continue Metformin as prescribed.  - Last hemoglobin A1C obtained 04/09/2020 was at goal at 6.7%, goal < 7%. - To achieve an A1C goal of less than  or equal to 7.0 percent, a fasting blood sugar of 80 to 130 mg/dL and a postprandial glucose (90 to 120 minutes after a meal) less than 180 mg/dL. In the event of sugars less than 60 mg/dl or greater than 400 mg/dl please notify the clinic ASAP. It is recommended that you undergo  annual eye exams and annual foot exams. - Discussed the importance of healthy eating habits, low-carbohydrate diet, low-sugar diet, regular aerobic exercise (at least 150 minutes a week as tolerated) and medication compliance to achieve or maintain control of diabetes. - Follow-up with primary physician in 1 month or sooner if needed.  3. Language barrier: - [employer] participated during today's visit. Interpreter Name: Tish Frederickson, ID#: [id number].  Follow Up Instructions: Follow-up with primary physician in 1 month.   Patient was given clear instructions to go to Emergency Department or return to medical center if symptoms don't improve, worsen, or new problems develop.The patient verbalized understanding.  I discussed the assessment and treatment plan with the patient. The patient was provided an opportunity to ask questions and all were answered. The patient agreed with the plan and demonstrated an understanding of the instructions.   The patient was advised to call back or seek an in-person evaluation if the symptoms worsen or if the condition fails to improve as anticipated.  I provided 20 minutes total of non-face-to-face time during this encounter including median intraservice time, reviewing previous notes, labs, imaging, medications, management and patient verbalized understanding.  Camillia Herter, NP  [facility] [place], [place]   06/03/2020, 3:47 PM

## 2020-06-04 NOTE — Patient Instructions (Signed)
Informacin bsica sobre la diabetes Diabetes Basics  La diabetes (diabetes mellitus) es una enfermedad de larga duracin (crnica). Se produce cuando el cuerpo no utiliza correctamente el azcar (glucosa) que se libera de los alimentos despus de comer. La diabetes puede deberse a uno de estos problemas o a ambos:  El pncreas no produce suficiente cantidad de una hormona llamada insulina.  El cuerpo no reacciona de forma normal a la insulina que produce. La insulina permite que ciertos azcares (glucosa) ingresen a las clulas del cuerpo. Esto le proporciona energa. Si tiene diabetes, los azcares no pueden ingresar a las clulas. Esto produce un aumento del nivel de azcar en la sangre (hiperglucemia). Sigue estas instrucciones en tu casa: Cmo se trata la diabetes? Es posible que tenga que administrarse insulina u otros medicamentos para la diabetes todos los das para mantener el nivel de azcar en la sangre equilibrado. Adminstrese los medicamentos para la diabetes todos los das como se lo haya indicado el mdico. Haga una lista de los medicamentos para la diabetes aqu: Medicamentos para la diabetes  Nombre del medicamento: ______________________________ ? Cantidad (dosis): ________________ Hora (a.m./p.m.): _______________ Notas: ___________________________________  Nombre del medicamento: ______________________________ ? Cantidad (dosis): ________________ Hora (a.m./p.m.): _______________ Notas: ___________________________________  Nombre del medicamento: ______________________________ ? Cantidad (dosis): ________________ Hora (a.m./p.m.): _______________ Notas: ___________________________________ Si usa insulina, aprender cmo aplicrsela con inyecciones. Es posible que deba ajustar la cantidad en funcin de los alimentos que coma. Haga una lista de los tipos de insulina que usa aqu: Insulina  Tipo de insulina: ______________________________ ? Cantidad (dosis):  ________________ Hora (a.m./p.m.): _______________ Notas: ___________________________________  Tipo de insulina: ______________________________ ? Cantidad (dosis): ________________ Hora (a.m./p.m.): _______________ Notas: ___________________________________  Tipo de insulina: ______________________________ ? Cantidad (dosis): ________________ Hora (a.m./p.m.): _______________ Notas: ___________________________________  Tipo de insulina: ______________________________ ? Cantidad (dosis): ________________ Hora (a.m./p.m.): _______________ Notas: ___________________________________  Tipo de insulina: ______________________________ ? Cantidad (dosis): ________________ Hora (a.m./p.m.): _______________ Notas: ___________________________________ Cmo me controlo el nivel de azcar en la sangre?  Controle sus niveles de azcar en la sangre con un medidor de glucemia segn las indicaciones del mdico. El mdico fijar los objetivos del tratamiento para usted. Generalmente, los resultados de los niveles de azcar en la sangre deben ser los siguientes:  Antes de las comidas (preprandial): de 80 a 130mg/dl (de 4,4 a 7,2mmol/l).  Despus de las comidas (posprandial): por debajo de 180mg/dl (10mmol/l).  Nivel de A1c: menos del 7%. Anote las veces que se controlar los niveles de azcar en la sangre: Controles de azcar en la sangre  Hora: _______________ Notas: ___________________________________  Hora: _______________ Notas: ___________________________________  Hora: _______________ Notas: ___________________________________  Hora: _______________ Notas: ___________________________________  Hora: _______________ Notas: ___________________________________  Hora: _______________ Notas: ___________________________________  Qu debo saber acerca del nivel bajo de azcar en la sangre? Un nivel bajo de azcar en la sangre se denomina hipoglucemia. Este cuadro ocurre cuando el  nivel de azcar en la sangre es igual o menor que 70mg/dl (3,9mmol/l). Entre los sntomas, se pueden incluir los siguientes:  Sentir: ? Hambre. ? Preocupacin o nervios (ansiedad). ? Sudoracin y piel hmeda. ? Confusin. ? Mareos. ? Somnolencia. ? Ganas de vomitar (nuseas).  Tener: ? Latidos cardacos acelerados. ? Dolor de cabeza. ? Cambios en la visin. ? Hormigueo y falta de sensibilidad (entumecimiento) alrededor de la boca, los labios o la lengua. ? Movimientos espasmdicos que no puede controlar (convulsiones).  Dificultades para hacer lo siguiente: ? Moverse (coordinacin). ? Dormir. ? Desmayos. ? Molestarse con facilidad (irritabilidad). Tratamiento del nivel   bajo de azcar en la sangre Para tratar un nivel bajo de azcar en la sangre, ingiera un alimento o una bebida azucarada de inmediato. Si puede pensar con claridad y tragar de manera segura, siga la regla 15/15, que consiste en lo siguiente:  Consuma 15gramos de un hidrato de carbono de accin rpida (carbohidrato). Hable con su mdico acerca de cunto debera consumir.  Algunos hidratos de carbono de accin rpida son: ? Comprimidos de azcar (pastillas de glucosa). Consuma 3o 4pastillas de glucosa. ? De 6 a 8unidades de caramelos duros. ? De 4 a 6onzas (de 120 a 150ml) de jugo de frutas. ? De 4 a 6onzas (de 120 a 150ml) de refresco comn (no diettico). ? 1 cucharada (15ml) de miel o azcar.  Contrlese el nivel de azcar en la sangre 15minutos despus de ingerir el hidrato de carbono.  Si el nivel de azcar en la sangre todava es igual o menor que 70mg/dl (3,9mmol/l), ingiera nuevamente 15gramos de un hidrato de carbono.  Si el nivel de azcar en la sangre no supera los 70mg/dl (3,9mmol/l) despus de 3intentos, solicite ayuda de inmediato.  Ingiera una comida o una colacin en el transcurso de 1hora despus de que el nivel de azcar en la sangre se haya normalizado. Tratamiento del nivel  muy bajo de azcar en la sangre Si el nivel de azcar en la sangre es igual o menor que 54mg/dl (3mmol/l), significa que est muy bajo (hipoglucemia grave). Esto es una emergencia. No espere a ver si los sntomas desaparecen. Solicite atencin mdica de inmediato. Comunquese con el servicio de emergencias de su localidad (911 en los [place]). No conduzca por sus propios medios hasta el hospital. Preguntas para hacerle al mdico  Es necesario que me rena con un instructor en el cuidado de la diabetes?  Qu equipos necesitar para cuidarme en casa?  Qu medicamentos para la diabetes necesito? Cundo debo tomarlos?  Con qu frecuencia debo controlar mi nivel de azcar en la sangre?  A qu nmero puedo llamar si tengo preguntas?  Cundo es mi prxima cita con el mdico?  Dnde puedo encontrar un grupo de apoyo para las personas con diabetes? Dnde buscar ms informacin  American Diabetes Association (Asociacin Estadounidense de la Diabetes): www.diabetes.org  American Association of Diabetes Educators (Asociacin Estadounidense de Instructores para el Cuidado de la Diabetes): www.diabeteseducator.org/patient-resources Comunquese con un mdico si:  El nivel de azcar en la sangre es igual o mayor que 240mg/dl (13,3mmol/dl) durante 2das seguidos.  Ha estado enfermo o ha tenido fiebre durante 2das o ms y no mejora.  Tiene alguno de estos problemas durante ms de 6horas: ? No puede comer ni beber. ? Siente malestar estomacal (nuseas). ? Vomita. ? Presenta heces lquidas (diarrea). Solicite ayuda inmediatamente si:  El nivel de azcar en la sangre est por debajo de 54mg/dl (3mmol/l).  Se siente confundida.  Tiene dificultad para hacer lo siguiente: ? Pensar con claridad. ? La respiracin. Resumen  La diabetes (diabetes mellitus) es una enfermedad de larga duracin (crnica). Se produce cuando el cuerpo no utiliza correctamente el azcar (glucosa)  que se libera de los alimentos despus de la digestin.  Aplquese la insulina y tome los medicamentos para la diabetes como se lo hayan indicado.  Contrlese el nivel de azcar en la sangre todos los das, con la frecuencia que le hayan indicado.  Concurra a todas las visitas de seguimiento como se lo haya indicado el mdico. Esto es importante. Esta informacin no tiene como fin reemplazar   el consejo del mdico. Asegrese de hacerle al mdico cualquier pregunta que tenga. Document Revised: 09/11/2018 Document Reviewed: 11/23/2017 Elsevier Patient Education  2020 Elsevier Inc.  

## 2020-06-07 NOTE — BH Specialist Note (Signed)
LCSW met with patient during visit with Provider. [employer] utilized for Spanish translation (Andrea-id#[id number]) LCSW introduced self and explained role at [facility]. Pt was informed of [facility] referral from Provider to address elevated depression screen.   Pt reports that he is not experiencing depression or anxiety symptoms. He denied any current stressors and declined need for any supportive resources.   LCSW encouraged pt to contact clinic with any behavioral health and/or resources that may arise in the future. No additional concerns noted.

## 2020-06-23 ENCOUNTER — Ambulatory Visit: Payer: Self-pay | Attending: Internal Medicine

## 2020-06-23 ENCOUNTER — Other Ambulatory Visit: Payer: Self-pay

## 2020-07-09 ENCOUNTER — Ambulatory Visit: Payer: Self-pay | Admitting: Pharmacist

## 2020-07-21 ENCOUNTER — Ambulatory Visit: Payer: Self-pay | Attending: Internal Medicine | Admitting: Pharmacist

## 2020-07-21 ENCOUNTER — Other Ambulatory Visit: Payer: Self-pay

## 2020-07-21 DIAGNOSIS — Z23 Encounter for immunization: Secondary | ICD-10-CM

## 2020-07-21 NOTE — Progress Notes (Signed)
Patient presents for vaccination against strep pneumo per orders of Dr. Laural Benes. Consent given. Counseling provided. No contraindications exists. Vaccine administered without incident.   Patient assistance coordinated with our pharmacy.   Butch Penny, PharmD, [place]y [place], CPP Clinical Pharmacist [facility] & [facility] [phone]

## 2020-08-03 ENCOUNTER — Ambulatory Visit: Payer: Self-pay | Attending: Internal Medicine | Admitting: Internal Medicine

## 2020-08-03 ENCOUNTER — Other Ambulatory Visit: Payer: Self-pay

## 2020-08-03 ENCOUNTER — Other Ambulatory Visit: Payer: Self-pay | Admitting: Internal Medicine

## 2020-08-03 DIAGNOSIS — E1169 Type 2 diabetes mellitus with other specified complication: Secondary | ICD-10-CM

## 2020-08-03 DIAGNOSIS — Z8616 Personal history of COVID-19: Secondary | ICD-10-CM

## 2020-08-03 DIAGNOSIS — E669 Obesity, unspecified: Secondary | ICD-10-CM

## 2020-08-03 MED ORDER — METFORMIN HCL 500 MG PO TABS: 500.0000 mg | ORAL_TABLET | Freq: Two times a day (BID) | ORAL | 3 refills | Status: DC

## 2020-08-03 MED FILL — METFORMIN HCL 500 MG TABS: 500 | 30 days supply | Qty: 60 | Fill #0

## 2020-08-03 NOTE — Progress Notes (Signed)
Virtual Visit via Telephone Note  I connected with Andrew Allen on 08/03/20 at 3:00 p.m EST by telephone and verified that I am speaking with the correct person using two identifiers.  Location: Patient: home Provider: office My CMA Sallyanne Havers, the pt, myself and Camila from [place] [phone]) participated in this visit.   I discussed the limitations, risks, security and privacy concerns of performing an evaluation and management service by telephone and the availability of in person appointments. I also discussed with the patient that there may be a patient responsible charge related to this service. The patient expressed understanding and agreed to proceed.   History of Present Illness: Patient with history of Covid pneumonia 01/2020, DM  Pt saw Dr. Joya Gaskins x 2 since last visit with me 03/2020.  He is off O2 and back to work.  Doing well.  DM:  Out of Metformin x 2 wks.  Checks BS Q 3-4 days.  Range in the 90s in the mornings when he was on the med.  -walks and sometimes runs in the mornings for 1/2 hr. -doing well with eating habits  Outpatient Encounter Medications as of 08/03/2020  Medication Sig  . Blood Glucose Monitoring Suppl (TRUE METRIX METER) w/Device KIT Use to measure blood sugar twice a day  . glucose blood (TRUE METRIX BLOOD GLUCOSE TEST) test strip Use as instructed  . metFORMIN (GLUCOPHAGE) 500 MG tablet Take 1 tablet (500 mg total) by mouth 2 (two) times daily with a meal.  . TRUEplus Lancets 28G MISC Use to measure blood sugar twice a day   No facility-administered encounter medications on file as of 08/03/2020.      Observations/Objective:   Chemistry      Component Value Date/Time   NA 141 04/26/2020 1209   K 3.5 04/26/2020 1209   CL 102 04/26/2020 1209   CO2 23 04/26/2020 1209   BUN 14 04/26/2020 1209   CREATININE 0.66 (L) 04/26/2020 1209      Component Value Date/Time   CALCIUM 9.7 04/26/2020 1209   ALKPHOS 91 04/26/2020 1209   AST 22  04/26/2020 1209   ALT 28 04/26/2020 1209   BILITOT 0.4 04/26/2020 1209       Assessment and Plan: 1. Diabetes mellitus type 2 in obese [facility]) Refill sent to the pharmacy on Metformin.  Encourage him to continue healthy eating habits and regular exercise. Recommend that he get his eyes checked at least once a year with an optometrist or ophthalmologist - metFORMIN (GLUCOPHAGE) 500 MG tablet; Take 1 tablet (500 mg total) by mouth 2 (two) times daily with a meal.  Dispense: 120 tablet; Refill: 3 - Hemoglobin A1c; Future - Lipid panel; Future  2. History of 2019 novel coronavirus disease (COVID-19) Strongly encouraged him to get the COVID-19 vaccine   Follow Up Instructions: 4 mths   I discussed the assessment and treatment plan with the patient. The patient was provided an opportunity to ask questions and all were answered. The patient agreed with the plan and demonstrated an understanding of the instructions.   The patient was advised to call back or seek an in-person evaluation if the symptoms worsen or if the condition fails to improve as anticipated.  I provided 12 minutes of non-face-to-face time during this encounter.   Karle Plumber, MD

## 2020-08-04 ENCOUNTER — Other Ambulatory Visit: Payer: Self-pay

## 2020-08-05 ENCOUNTER — Ambulatory Visit: Payer: Self-pay | Attending: Internal Medicine

## 2020-08-05 ENCOUNTER — Other Ambulatory Visit: Payer: Self-pay

## 2020-08-05 DIAGNOSIS — E669 Obesity, unspecified: Secondary | ICD-10-CM

## 2020-08-05 DIAGNOSIS — E1169 Type 2 diabetes mellitus with other specified complication: Secondary | ICD-10-CM

## 2020-08-06 LAB — LIPID PANEL
Chol/HDL Ratio: 3.6 ratio (ref 0.0–5.0)
Cholesterol, Total: 134 mg/dL (ref 100–199)
HDL: 37 mg/dL — ABNORMAL LOW (ref >39)
LDL Chol Calc (NIH): 80 mg/dL (ref 0–99)
Triglycerides: 86 mg/dL (ref 0–149)
VLDL Cholesterol Cal: 17 mg/dL (ref 5–40)

## 2020-08-06 LAB — HEMOGLOBIN A1C
Est. average glucose Bld gHb Est-mCnc: 120 mg/dL
Hgb A1c MFr Bld: 5.8 % — ABNORMAL HIGH (ref 4.8–5.6)

## 2020-08-07 NOTE — Progress Notes (Signed)
Let patient know that his A1c is 5.8 which means that his diabetes is under good control.  Continue Metformin.  LDL cholesterol is 80 with goal being less than 70.  Continue to work on healthy eating habits and regular exercise.

## 2020-08-11 ENCOUNTER — Telehealth: Payer: Self-pay | Admitting: Internal Medicine

## 2020-08-11 NOTE — Telephone Encounter (Signed)
Pt returning call regarding latest labwork, pt asked to be reached at [phone]. thank you.

## 2020-08-12 ENCOUNTER — Telehealth: Payer: Self-pay

## 2020-08-12 NOTE — Telephone Encounter (Signed)
[employer] Gunnar Fusi  Id# [phone]  contacted pt to go over lab results pt is aware and doesn't have any questions or concerns

## 2020-08-12 NOTE — Telephone Encounter (Signed)
Pt is aware of results. 

## 2020-12-02 ENCOUNTER — Ambulatory Visit: Payer: Self-pay | Admitting: Internal Medicine

## 2020-12-14 ENCOUNTER — Ambulatory Visit: Payer: Self-pay | Attending: Internal Medicine | Admitting: Internal Medicine

## 2020-12-14 ENCOUNTER — Other Ambulatory Visit: Payer: Self-pay

## 2020-12-14 ENCOUNTER — Encounter: Payer: Self-pay | Admitting: Internal Medicine

## 2020-12-14 VITALS — BP 116/76 | HR 64 | Resp 16 | Wt 178.4 lb

## 2020-12-14 DIAGNOSIS — E669 Obesity, unspecified: Secondary | ICD-10-CM

## 2020-12-14 DIAGNOSIS — E1169 Type 2 diabetes mellitus with other specified complication: Secondary | ICD-10-CM

## 2020-12-14 DIAGNOSIS — Z28311 Partially vaccinated for covid-19: Secondary | ICD-10-CM

## 2020-12-14 DIAGNOSIS — Z289 Immunization not carried out for unspecified reason: Secondary | ICD-10-CM | POA: Insufficient documentation

## 2020-12-14 LAB — GLUCOSE, POCT (MANUAL RESULT ENTRY): POC Glucose: 109 mg/dl — AB (ref 70–99)

## 2020-12-14 MED ORDER — METFORMIN HCL 500 MG PO TABS
ORAL_TABLET | Freq: Two times a day (BID) | ORAL | 3 refills | Status: AC
  Filled 2020-12-14: qty 60, 30d supply, fill #0

## 2020-12-14 NOTE — Progress Notes (Signed)
Patient ID: Andrew Allen, male    DOB: [birth year]  MRN: [id number]  CC: No chief complaint on file.   Subjective: Andrew Allen is a 35 y.o. male who presents for chronic ds management His concerns today include:  DM type 2, COVID pneumonia  Pt reports he is doing well. Checks BS 2 x/wk.  Gives range 120-135. Doing well with eating habits.  Tries to avoid foods that he knows will elev BS Walks for 30 mins 3 days a wk.  Wgh has stayed stable Over due for eye.  No blurred vision. No numbness in feet. Taking well tolerating metformin. Results for orders placed or performed in visit on 12/14/20  POCT glucose (manual entry)  Result Value Ref Range   POC Glucose 109 (A) 70 - 99 mg/dl   Lab Results  Component Value Date   HGBA1C 5.8 (H) 08/05/2020   HM: due for eye exam. He has not started COVID-19 vaccine.  Not sure about getting the vaccine.   Patient Active Problem List   Diagnosis Date Noted  . Type 2 diabetes mellitus with hyperglycemia, without long-term current use of insulin ([place]) 04/26/2020     Current Outpatient Medications on File Prior to Visit  Medication Sig Dispense Refill  . Blood Glucose Monitoring Suppl (TRUE METRIX METER) w/Device KIT Use to measure blood sugar twice a day 1 kit 0  . glucose blood (TRUE METRIX BLOOD GLUCOSE TEST) test strip Use as instructed 100 each 12  . metFORMIN (GLUCOPHAGE) 500 MG tablet TAKE 1 TABLET (500 MG TOTAL) BY MOUTH 2 (TWO) TIMES DAILY WITH A MEAL. 120 tablet 3  . TRUEplus Lancets 28G MISC Use to measure blood sugar twice a day 100 each 1   No current facility-administered medications on file prior to visit.    No Known Allergies  Social History   Socioeconomic History  . Marital status: Married    Spouse name: Not on file  . Number of children: Not on file  . Years of education: Not on file  . Highest education level: Not on file  Occupational History  . Not on file  Tobacco Use  . Smoking  status: Never Smoker  . Smokeless tobacco: Never Used  Substance and Sexual Activity  . Alcohol use: No  . Drug use: No  . Sexual activity: Not on file  Other Topics Concern  . Not on file  Social History Narrative  . Not on file   Social Determinants of Health   Financial Resource Strain: Not on file  Food Insecurity: Not on file  Transportation Needs: Not on file  Physical Activity: Not on file  Stress: Not on file  Social Connections: Not on file  Intimate Partner Violence: Not on file    Family History  Family history unknown: Yes    No past surgical history on file.  ROS: Review of Systems Negative except as stated above  PHYSICAL EXAM: BP 116/76   Pulse 64   Resp 16   Wt 178 lb 6.4 oz (80.9 kg)   SpO2 96%   BMI 30.62 kg/m   Wt Readings from Last 3 Encounters:  12/14/20 178 lb 6.4 oz (80.9 kg)  05/19/20 177 lb 9.6 oz (80.6 kg)  04/26/20 177 lb 9.6 oz (80.6 kg)    Physical Exam  General appearance - alert, well appearing, young Hispanic male and in no distress Mental status - normal mood, behavior, speech, dress, motor activity, and thought processes Chest -  clear to auscultation, no wheezes, rales or rhonchi, symmetric air entry Heart - normal rate, regular rhythm, normal S1, S2, no murmurs, rubs, clicks or gallops Extremities - peripheral pulses normal, no pedal edema, no clubbing or cyanosis Diabetic Foot Exam - Simple   Simple Foot Form Visual Inspection See comments: Yes Sensation Testing Intact to touch and monofilament testing bilaterally: Yes Pulse Check Posterior Tibialis and Dorsalis pulse intact bilaterally: Yes Comments Non-inflammed bunion on RT big toe     CMP Latest Ref Rng & Units 04/26/2020 04/09/2020 03/08/2020  Glucose 65 - 99 mg/dL 81 183(H) 189(H)  BUN 6 - 20 mg/dL 14 11 17   Creatinine 0.76 - 1.27 mg/dL 0.66(L) 0.70(L) 0.86  Sodium 134 - 144 mmol/L 141 136 134(L)  Potassium 3.5 - 5.2 mmol/L 3.5 4.1 4.0  Chloride 96 - 106  mmol/L 102 102 98  CO2 20 - 29 mmol/L 23 23 27   Calcium 8.7 - 10.2 mg/dL 9.7 9.6 8.4(L)  Total Protein 6.0 - 8.5 g/dL 7.9 - 5.8(L)  Total Bilirubin 0.0 - 1.2 mg/dL 0.4 - 0.6  Alkaline Phos 44 - 121 IU/L 91 - 74  AST 0 - 40 IU/L 22 - 18  ALT 0 - 44 IU/L 28 - 36   Lipid Panel     Component Value Date/Time   CHOL 134 08/05/2020 0855   TRIG 86 08/05/2020 0855   HDL 37 (L) 08/05/2020 0855   CHOLHDL 3.6 08/05/2020 0855   LDLCALC 80 08/05/2020 0855    CBC    Component Value Date/Time   WBC 13.0 (H) 04/26/2020 1209   WBC 13.8 (H) 03/09/2020 0500   RBC 4.92 04/26/2020 1209   RBC 5.04 03/09/2020 0500   HGB 15.0 04/26/2020 1209   HCT 44.1 04/26/2020 1209   PLT 187 04/26/2020 1209   MCV 90 04/26/2020 1209   MCH 30.5 04/26/2020 1209   MCH 29.6 03/09/2020 0500   MCHC 34.0 04/26/2020 1209   MCHC 33.0 03/09/2020 0500   RDW 14.4 04/26/2020 1209   LYMPHSABS 4.7 (H) 04/26/2020 1209   MONOABS 0.5 02/28/2020 0142   EOSABS 0.1 04/26/2020 1209   BASOSABS 0.2 04/26/2020 1209    ASSESSMENT AND PLAN: 1. Diabetes mellitus type 2 in obese (HCC) Continue metformin, healthy eating and regular exercise.  Encouraged her to increase exercise to about 150 minutes/week total. -Encouraged him to get diabetic eye exam done at least once a year.  Given the names of several places where he can get the eye exam at a reasonable cost. - POCT glucose (manual entry) - metFORMIN (GLUCOPHAGE) 500 MG tablet; TAKE 1 TABLET (500 MG TOTAL) BY MOUTH 2 (TWO) TIMES DAILY WITH A MEAL.  Dispense: 180 tablet; Refill: 3  2. COVID-19 vaccine series not completed Strongly advised him to get the COVID-19 vaccine.  There are different strains of the virus.  Even though he had COVID last year which was very severe, he could potentially become reinfected again if he is not careful.     Patient was given the opportunity to ask questions.  Patient verbalized understanding of the plan and was able to repeat key elements of the  plan.  [employer] Language interpreter used during this encounter. #[id number], Cristian  Orders Placed This Encounter  Procedures  . POCT glucose (manual entry)     Requested Prescriptions    No prescriptions requested or ordered in this encounter    No follow-ups on file.  Karle Plumber, MD, FACP

## 2020-12-14 NOTE — Patient Instructions (Signed)
Diabetes mellitus y nutricin, en adultos Diabetes Mellitus and Nutrition, Adult Si sufre de diabetes, o diabetes mellitus, es muy importante tener hbitos alimenticios saludables debido a que sus niveles de azcar en la sangre (glucosa) se ven afectados en gran medida por lo que come y bebe. Comer alimentos saludables en las cantidades correctas, aproximadamente a la misma hora todos los das, lo ayudar a: Controlar la glucemia. Disminuir el riesgo de sufrir una enfermedad cardaca. Mejorar la presin arterial. Alcanzar o mantener un peso saludable. Qu puede afectar mi plan de alimentacin? Todas las personas que sufren de diabetes son diferentes y cada una tiene necesidades diferentes en cuanto a un plan de alimentacin. El mdico puede recomendarle que trabaje con un nutricionista para elaborar el mejor plan para usted. Su plan de alimentacin puede variar segn factores como: Las caloras que necesita. Los medicamentos que toma. Su peso. Sus niveles de glucemia, presin arterial y colesterol. Su nivel de actividad. Otras afecciones que tenga, como enfermedades cardacas o renales. Cmo me afectan los carbohidratos? Los carbohidratos, o hidratos de carbono, afectan su nivel de glucemia ms que cualquier otro tipo de alimento. La ingesta de carbohidratos naturalmente aumenta la cantidad de glucosa en la sangre. El recuento de carbohidratos es un mtodo destinado a llevar un registro de la cantidad de carbohidratos que se consumen. El recuento de carbohidratos es importante para mantener la glucemia a un nivel saludable, especialmente si utiliza insulina o toma determinados medicamentos por va oral para la diabetes. Es importante conocer la cantidad de carbohidratos que se pueden ingerir en cada comida sin correr ningn riesgo. Esto es diferente en cada persona. Su nutricionista puede ayudarlo a calcular la cantidad de carbohidratos que debe ingerir en cada comida y en cada refrigerio. Cmo  me afecta el alcohol? El alcohol puede provocar disminuciones sbitas de la glucemia (hipoglucemia), especialmente si utiliza insulina o toma determinados medicamentos por va oral para la diabetes. La hipoglucemia es una afeccin potencialmente mortal. Los sntomas de la hipoglucemia, como somnolencia, mareos y confusin, son similares a los sntomas de haber consumido demasiado alcohol. No beba alcohol si: Su mdico le indica no hacerlo. Est embarazada, puede estar embarazada o est tratando de quedar embarazada. Si bebe alcohol: No beba con el estmago vaco. Limite la cantidad que bebe: De 0 a 1 medida por da para las mujeres. De 0 a 2 medidas por da para los hombres. Est atento a la cantidad de alcohol que hay en las bebidas que toma. En los [place], una medida equivale a una botella de cerveza de 12 oz (355 ml), un vaso de vino de 5 oz (148 ml) o un vaso de una bebida alcohlica de alta graduacin de 1 oz (44 ml). Mantngase hidratado bebiendo agua, refrescos dietticos o t helado sin azcar. Tenga en cuenta que los refrescos comunes, los jugos y otras bebida para mezclar pueden contener mucha azcar y se deben contar como carbohidratos. Consejos para seguir este plan Leer las etiquetas de los alimentos Comience por leer el tamao de la porcin en la "Informacin nutricional" en las etiquetas de los alimentos envasados y las bebidas. La cantidad de caloras, carbohidratos, grasas y otros nutrientes mencionados en la etiqueta se basan en una porcin del alimento. Muchos alimentos contienen ms de una porcin por envase. Verifique la cantidad total de gramos (g) de carbohidratos totales en una porcin. Puede calcular la cantidad de porciones de carbohidratos al dividir el total de carbohidratos por 15. Por ejemplo, si un alimento tiene un   total de 30 g de carbohidratos totales por porcin, equivale a 2 porciones de carbohidratos. Verifique la cantidad de gramos (g) de grasas  saturadas y grasas trans de una porcin. Escoja alimentos que no contengan estas grasas o que su contenido de estas sea bajo. Verifique la cantidad de miligramos (mg) de sal (sodio) en una porcin. La mayora de las personas deben limitar la ingesta de sodio total a menos de 2300 mg por da. Siempre consulte la informacin nutricional de los alimentos etiquetados como "con bajo contenido de grasa" o "sin grasa". Estos alimentos pueden tener un mayor contenido de azcar agregada o carbohidratos refinados, y deben evitarse. Hable con su nutricionista para identificar sus objetivos diarios en cuanto a los nutrientes mencionados en la etiqueta. Al ir de compras Evite comprar alimentos procesados, enlatados o precocidos. Estos alimentos tienden a tener una mayor cantidad de grasa, sodio y azcar agregada. Compre en la zona exterior de la tienda de comestibles. Esta es la zona donde se encuentran con mayor frecuencia las frutas y las verduras frescas, los cereales a granel, las carnes frescas y los productos lcteos frescos. Al cocinar Utilice mtodos de coccin a baja temperatura, como hornear, en lugar de mtodos de coccin a alta temperatura, como frer en abundante aceite. Cocine con aceites saludables, como el aceite de oliva, canola o girasol. Evite cocinar con manteca, crema o carnes con alto contenido de grasa. Planificacin de las comidas Coma las comidas y los refrigerios regularmente, preferentemente a la misma hora todos los das. Evite pasar largos perodos de tiempo sin comer. Consuma alimentos ricos en fibra, como frutas frescas, verduras, frijoles y cereales integrales. Consulte a su nutricionista sobre cuntas porciones de carbohidratos puede consumir en cada comida. Consuma entre 4 y 6 onzas (entre 112 y 168 g) de protenas magras por da, como carnes magras, pollo, pescado, huevos o tofu. Una onza (oz) de protena magra equivale a: 1 onza (28 g) de carne, pollo o pescado. 1 huevo.  de  taza (62 g) de tofu. Coma algunos alimentos por da que contengan grasas saludables, como aguacates, frutos secos, semillas y pescado. Qu alimentos debo comer? Frutas Bayas. Manzanas. Naranjas. Duraznos. Damascos. Ciruelas. Uvas. Mango. Papaya. Granada. Kiwi. Cerezas. Verduras Lechuga. Espinaca. Verduras de hoja verde, que incluyen col rizada, acelga, hojas de berza y de mostaza. Remolachas. Coliflor. Repollo. Brcoli. Zanahorias. Judas verdes. Tomates. Pimientos. Cebollas. Pepinos. Coles de Bruselas. Granos Granos integrales, como panes, galletas, tortillas, cereales y pastas de salvado o integrales. Avena sin azcar. Quinua. Arroz integral o salvaje. Carnes y otras protenas Mariscos. Carne de ave sin piel. Cortes magros de ave y carne de res. Tofu. Frutos secos. Semillas. Lcteos Productos lcteos sin grasa o con bajo contenido de grasa, como leche, yogur y queso. Es posible que los productos que se enumeran ms arriba no constituyan una lista completa de los alimentos y las bebidas que puede tomar. Consulte a un nutricionista para obtener ms informacin. Qu alimentos debo evitar? Frutas Frutas enlatadas al almbar. Verduras Verduras enlatadas. Verduras congeladas con mantequilla o salsa de crema. Granos Productos elaborados con harina y harina blanca refinada, como panes, pastas, bocadillos y cereales. Evite todos los alimentos procesados. Carnes y otras protenas Cortes de carne con alto contenido de grasa. Carne de ave con piel. Carnes empanizadas o fritas. Carne procesada. Evite las grasas saturadas. Lcteos Yogur, queso o leche enteros. Bebidas Bebidas azucaradas, como gaseosas o t helado. Es posible que los productos que se enumeran ms arriba no constituyan una lista completa de   los alimentos y las bebidas que debe evitar. Consulte a un nutricionista para obtener ms informacin. Preguntas para hacerle al mdico Es necesario que me rena con un instructor en el cuidado  de la diabetes? Es necesario que me rena con un nutricionista? A qu nmero puedo llamar si tengo preguntas? Cules son los mejores momentos para controlar la glucemia? Dnde encontrar ms informacin: Asociacin Estadounidense de la Diabetes (American Diabetes Association): diabetes.org Academy of Nutrition and Dietetics (Academia de Nutricin y Diettica): www.eatright.org National Institute of Diabetes and Digestive and Kidney Diseases (Instituto Nacional de la Diabetes y las Enfermedades Digestivas y Renales): www.niddk.nih.gov Association of Diabetes Care and Education Specialists (Asociacin de Especialistas en Atencin y Educacin sobre la Diabetes): www.diabeteseducator.org Resumen Es importante tener hbitos alimenticios saludables debido a que sus niveles de azcar en la sangre (glucosa) se ven afectados en gran medida por lo que come y bebe. Un plan de alimentacin saludable lo ayudar a controlar la glucemia y mantener un estilo de vida saludable. El mdico puede recomendarle que trabaje con un nutricionista para elaborar el mejor plan para usted. Tenga en cuenta que los carbohidratos (hidratos de carbono) y el alcohol tienen efectos inmediatos en sus niveles de glucemia. Es importante contar los carbohidratos que ingiere y consumir alcohol con prudencia. Esta informacin no tiene como fin reemplazar el consejo del mdico. Asegrese de hacerle al mdico cualquier pregunta que tenga. Document Revised: 08/21/2019 Document Reviewed: 08/21/2019 Elsevier Patient Education  2021 [employer]vier Inc.  

## 2020-12-16 ENCOUNTER — Other Ambulatory Visit: Payer: Self-pay

## 2021-07-27 ENCOUNTER — Other Ambulatory Visit: Payer: Self-pay

## 2021-07-27 ENCOUNTER — Ambulatory Visit (HOSPITAL_COMMUNITY)
Admission: EM | Admit: 2021-07-27 | Discharge: 2021-07-27 | Disposition: A | Discharge status: Home / Self Care | Payer: Self-pay | Attending: Urgent Care | Admitting: Urgent Care

## 2021-07-27 ENCOUNTER — Encounter (HOSPITAL_COMMUNITY): Payer: Self-pay

## 2021-07-27 ENCOUNTER — Ambulatory Visit (INDEPENDENT_AMBULATORY_CARE_PROVIDER_SITE_OTHER): Payer: Self-pay

## 2021-07-27 DIAGNOSIS — R059 Cough, unspecified: Secondary | ICD-10-CM

## 2021-07-27 DIAGNOSIS — R509 Fever, unspecified: Secondary | ICD-10-CM

## 2021-07-27 DIAGNOSIS — J189 Pneumonia, unspecified organism: Secondary | ICD-10-CM

## 2021-07-27 MED ORDER — DOXYCYCLINE HYCLATE 100 MG PO TABS
100.0000 mg | ORAL_TABLET | Freq: Two times a day (BID) | ORAL | 0 refills | Status: AC
  Filled 2021-07-27: qty 20, 10d supply, fill #0

## 2021-07-27 MED ORDER — GUAIFENESIN ER 600 MG PO TB12
600.0000 mg | ORAL_TABLET | Freq: Two times a day (BID) | ORAL | 0 refills | Status: AC
  Filled 2021-07-27: qty 20, 10d supply, fill #0

## 2021-07-27 NOTE — ED Provider Notes (Signed)
[place]    CSN: [id number] Arrival date & time: 07/27/21  0940      History   Chief Complaint Chief Complaint  Patient presents with   Cough   Generalized Body Aches   Headache   Chills    HPI Lauren Aguayo Arna Medici is a 35 y.o. male.   35 year old male presents today for a 2-week history of cough, congestion, feeling feverish without a noted temperature.  He also has had chills and aches.  1-1/2 years ago he was diagnosed with COVID pneumonia and was hospitalized due to this.  Patient states he feels relatively similar to how he did then.  He has been taking over-the-counter Advil without response to his symptoms.  He states he is the only one sick in his house and his wife and kids are spared.  Patient does not smoke.  He denies any chronic pulmonary disease.  He does have a known history of diabetes for which he takes metformin, his last A1c was 5.8%.  He does not check his glucose daily but states he feels well controlled.   Cough Associated symptoms: chills, fever, headaches and myalgias   Associated symptoms: no ear pain and no sore throat   Headache Associated symptoms: congestion, cough, fever and myalgias   Associated symptoms: no drainage, no ear pain, no sinus pressure and no sore throat    Past Medical History:  Diagnosis Date   Acute respiratory failure due to COVID-19 ([place]) 02/28/2020   Pneumonia due to COVID-19 virus 03/01/2020    Patient Active Problem List   Diagnosis Date Noted   COVID-19 vaccine series not completed 12/14/2020   Diabetes mellitus type 2 in obese ([place]) 04/26/2020    History reviewed. No pertinent surgical history.     Home Medications    Prior to Admission medications   Medication Sig Start Date End Date Taking? Authorizing Provider  doxycycline (VIBRA-TABS) 100 MG tablet Take 1 tablet (100 mg total) by mouth 2 (two) times daily. 07/27/21  Yes Jacky Dross L, PA  guaiFENesin (MUCINEX) 600 MG 12 hr tablet Take 1  tablet (600 mg total) by mouth 2 (two) times daily for 10 days. 07/27/21 08/06/21 Yes Ericah Scotto L, PA  Blood Glucose Monitoring Suppl (TRUE METRIX METER) w/Device KIT Use to measure blood sugar twice a day 04/26/20   Elsie Stain, MD  glucose blood (TRUE METRIX BLOOD GLUCOSE TEST) test strip Use as instructed 04/26/20   Elsie Stain, MD  metFORMIN (GLUCOPHAGE) 500 MG tablet TAKE 1 TABLET (500 MG TOTAL) BY MOUTH 2 (TWO) TIMES DAILY WITH A MEAL. 12/14/20   Ladell Pier, MD  TRUEplus Lancets 28G MISC Use to measure blood sugar twice a day 04/26/20   Elsie Stain, MD    Family History Family History  Family history unknown: Yes    Social History Social History   Tobacco Use   Smoking status: Never   Smokeless tobacco: Never  Substance Use Topics   Alcohol use: No   Drug use: No     Allergies   Patient has no known allergies.   Review of Systems Review of Systems  Constitutional:  Positive for chills and fever.  HENT:  Positive for congestion. Negative for ear discharge, ear pain, facial swelling, nosebleeds, postnasal drip, sinus pressure, sinus pain, sneezing and sore throat.   Respiratory:  Positive for cough.   Musculoskeletal:  Positive for myalgias.  Neurological:  Positive for headaches.    Physical Exam Triage  Vital Signs ED Triage Vitals  Enc Vitals Group     BP 07/27/21 1101 122/81     Pulse Rate 07/27/21 1101 74     Resp 07/27/21 1101 17     Temp 07/27/21 1101 (!) 97.3 F (36.3 C)     Temp Source 07/27/21 1101 Oral     SpO2 07/27/21 1101 100 %     Weight --      Height --      Head Circumference --      Peak Flow --      Pain Score 07/27/21 1105 6     Pain Loc --      Pain Edu? --      Excl. in [place]? --    No data found.  Updated Vital Signs BP 122/81 (BP Location: Right Arm)    Pulse 74    Temp (!) 97.3 F (36.3 C) (Oral)    Resp 17    SpO2 100%   Visual Acuity Right Eye Distance:   Left Eye Distance:   Bilateral Distance:     Right Eye Near:   Left Eye Near:    Bilateral Near:     Physical Exam Vitals and nursing note reviewed.  Constitutional:      General: He is not in acute distress.    Appearance: He is well-developed. He is ill-appearing. He is not toxic-appearing or diaphoretic.  HENT:     Head: Normocephalic and atraumatic.     Mouth/Throat:     Mouth: Mucous membranes are moist.     Pharynx: Oropharynx is clear.  Eyes:     General: No scleral icterus.    Extraocular Movements: Extraocular movements intact.     Right eye: Normal extraocular motion and no nystagmus.     Left eye: Normal extraocular motion and no nystagmus.     Conjunctiva/sclera: Conjunctivae normal.     Pupils: Pupils are equal, round, and reactive to light. Pupils are equal.  Cardiovascular:     Rate and Rhythm: Normal rate and regular rhythm.     Heart sounds: Normal heart sounds. No murmur heard.   No friction rub. No gallop.  Pulmonary:     Effort: Pulmonary effort is normal. No respiratory distress.     Comments: Decreased breath sounds bilaterally with decreased tactile fremitus bibasilar.  Abdominal:     Palpations: Abdomen is soft.     Tenderness: There is no abdominal tenderness.  Musculoskeletal:        General: No swelling or tenderness.     Cervical back: Normal range of motion and neck supple. No rigidity.  Lymphadenopathy:     Cervical: No cervical adenopathy.  Skin:    General: Skin is warm and dry.     Capillary Refill: Capillary refill takes less than 2 seconds.  Neurological:     Mental Status: He is alert.  Psychiatric:        Mood and Affect: Mood normal.     UC Treatments / Results  Labs (all labs ordered are listed, but only abnormal results are displayed) Labs Reviewed - No data to display  EKG   Radiology DG Chest 2 View  Result Date: 07/27/2021 CLINICAL DATA:  Cough, fever congestion and chills. EXAM: CHEST - 2 VIEW COMPARISON:  Comparison made with April 09, 2020. FINDINGS:  Cardiomediastinal contours and hilar structures are stable. No lobar consolidation. Residual interstitial prominence at the lung bases. Marked improvement of interstitial opacities in the upper and mid chest  when compared to more remote imaging from 2021. No lobar consolidation. No sign of pleural effusion. On limited assessment there is no acute skeletal process. IMPRESSION: Marked improvement of interstitial opacities in the upper and mid chest when compared to more remote imaging from 2021. Favored to represent post infectious scarring. Difficult to exclude developing infection at the lung bases particularly RIGHT lung base on the current study. No lobar consolidation or pleural effusion. Electronically Signed   By: Zetta Bills M.D.   On: 07/27/2021 12:20    Procedures Procedures (including critical care time)  Medications Ordered in UC Medications - No data to display  Initial Impression / Assessment and Plan / UC Course  I have reviewed the triage vital signs and the nursing notes.  Pertinent labs & imaging results that were available during my care of the patient were reviewed by me and considered in my medical decision making (see chart for details).     CAP - symptoms consistent with developing pneumonia. Start abx as discussed, mucinex. F/U with PCP in 6 weeks for recheck CXR to ensure complete resolution.  Final Clinical Impressions(s) / UC Diagnoses   Final diagnoses:  Community acquired pneumonia of right upper lobe of lung     Discharge Instructions      Toma los pastillas dos veces cada dia hasta el fin. No puede tomar vitaminas cuando esta tomando antibioticos [employer] veces cada dia hasta no tiene tos [place] tu doctor familia para obtener una cita en seis semanas.      ED Prescriptions     Medication Sig Dispense Auth. Provider   doxycycline (VIBRA-TABS) 100 MG tablet Take 1 tablet (100 mg total) by mouth 2 (two) times daily. 20 tablet Margurette Brener L,  PA   guaiFENesin (MUCINEX) 600 MG 12 hr tablet Take 1 tablet (600 mg total) by mouth 2 (two) times daily for 10 days. 20 tablet Jenniah Bhavsar L, [place]      PDMP not reviewed this encounter.   Chaney Malling, [place] 07/27/21 1234

## 2021-07-27 NOTE — ED Triage Notes (Signed)
Pt presents with non productive cough, chills, generalized body aches, and headache X 2 weeks.

## 2021-07-27 NOTE — Discharge Instructions (Signed)
Toma los [employer] veces cada dia hasta el fin. No puede tomar vitaminas cuando esta tomando antibioticos [employer] veces cada dia hasta no tiene tos [place] tu doctor familia para obtener una cita en seis semanas.

## 2022-06-23 ENCOUNTER — Other Ambulatory Visit (HOSPITAL_COMMUNITY): Payer: Self-pay

## 2022-08-22 IMAGING — DX DG CHEST 2V
2 series · 2 of 2 positions shown · non-contrast
Comparison: Comparison made with April 09, 2020.

CLINICAL DATA: Cough, fever congestion and chills.

EXAM:
CHEST - 2 VIEW

[chest pa]
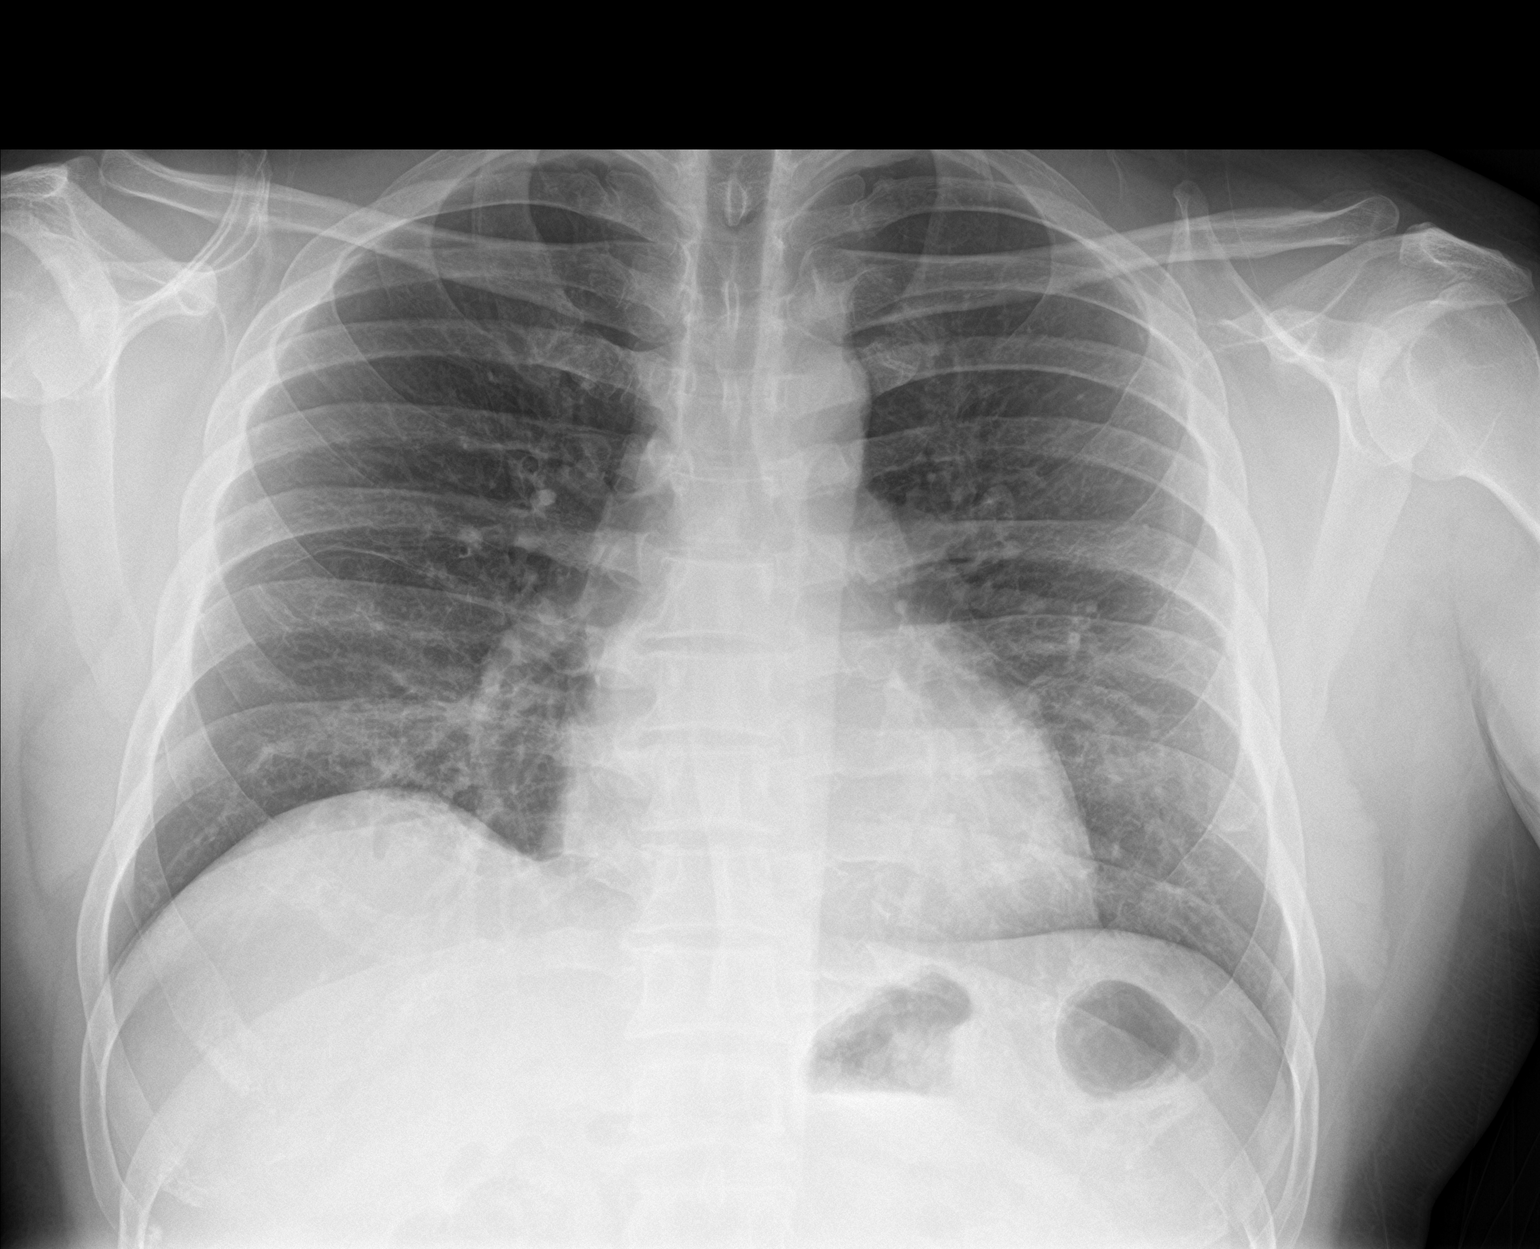

[chest lat]
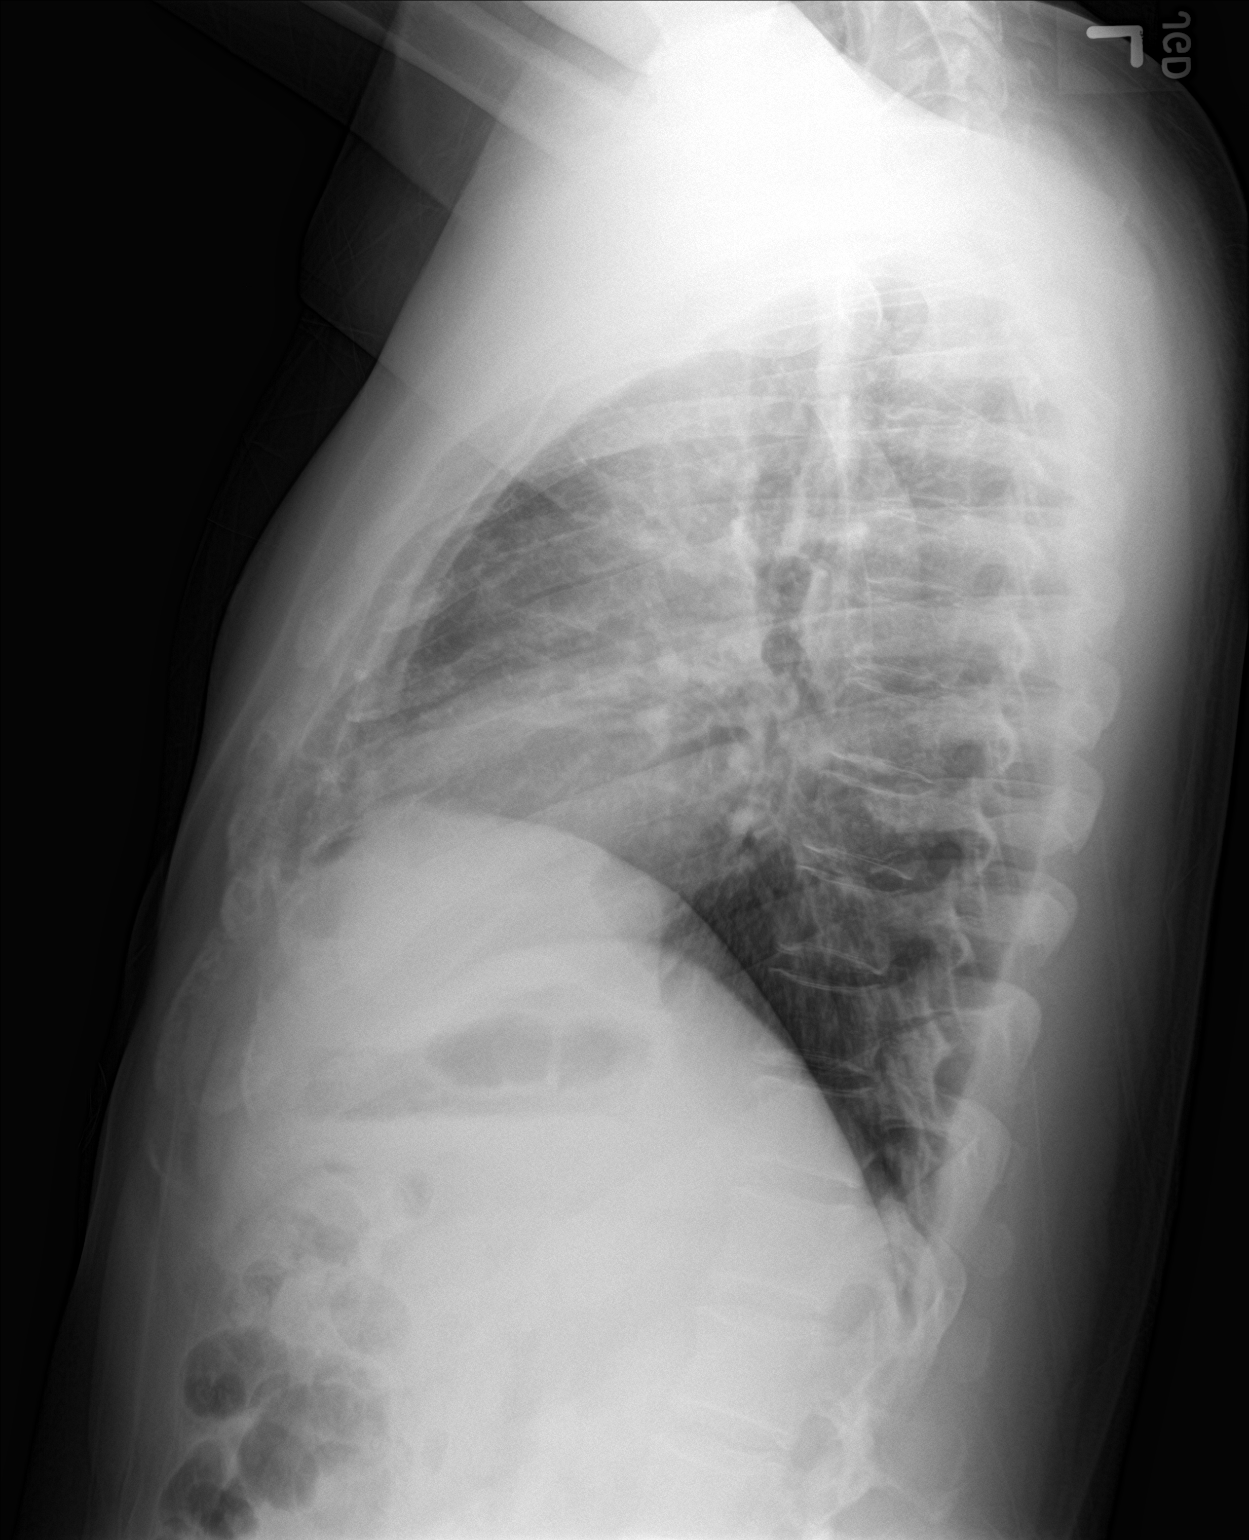

[2 of 2 positions shown; findings below may reference images not displayed]

FINDINGS: Cardiomediastinal contours and hilar structures are stable.

No lobar consolidation. Residual interstitial prominence at the lung
bases. Marked improvement of interstitial opacities in the upper and
mid chest when compared to more remote imaging from 7571. No lobar
consolidation. No sign of pleural effusion.

On limited assessment there is no acute skeletal process.
IMPRESSION: Marked improvement of interstitial opacities in the upper and mid
chest when compared to more remote imaging from 7571. Favored to
represent post infectious scarring. Difficult to exclude developing
infection at the lung bases particularly RIGHT lung base on the
current study. No lobar consolidation or pleural effusion.

## 2022-11-15 ENCOUNTER — Other Ambulatory Visit (HOSPITAL_COMMUNITY): Payer: Self-pay

## 2023-02-22 ENCOUNTER — Other Ambulatory Visit (HOSPITAL_COMMUNITY): Payer: Self-pay
